# Patient Record
Sex: Female | Born: 1950
Health system: Southern US, Community
[De-identification: ages and names within clinical notes are randomized; demographics above are authoritative.]

## PROBLEM LIST (undated history)

## (undated) DIAGNOSIS — R7303 Prediabetes: Secondary | ICD-10-CM

## (undated) DIAGNOSIS — R51 Headache: Secondary | ICD-10-CM

## (undated) DIAGNOSIS — M1990 Unspecified osteoarthritis, unspecified site: Secondary | ICD-10-CM

## (undated) DIAGNOSIS — I10 Essential (primary) hypertension: Secondary | ICD-10-CM

## (undated) DIAGNOSIS — K219 Gastro-esophageal reflux disease without esophagitis: Secondary | ICD-10-CM

## (undated) DIAGNOSIS — R519 Headache, unspecified: Secondary | ICD-10-CM

## (undated) DIAGNOSIS — L309 Dermatitis, unspecified: Secondary | ICD-10-CM

## (undated) DIAGNOSIS — T753XXA Motion sickness, initial encounter: Secondary | ICD-10-CM

## (undated) DIAGNOSIS — M25561 Pain in right knee: Secondary | ICD-10-CM

## (undated) DIAGNOSIS — J329 Chronic sinusitis, unspecified: Secondary | ICD-10-CM

## (undated) DIAGNOSIS — N952 Postmenopausal atrophic vaginitis: Secondary | ICD-10-CM

## (undated) HISTORY — DX: Postmenopausal atrophic vaginitis: N95.2

## (undated) HISTORY — PX: UTERINE FIBROID SURGERY: SHX826

## (undated) HISTORY — DX: Chronic sinusitis, unspecified: J32.9

## (undated) HISTORY — DX: Unspecified osteoarthritis, unspecified site: M19.90

---

## 2006-10-01 ENCOUNTER — Ambulatory Visit: Payer: Self-pay | Admitting: Internal Medicine

## 2007-10-05 ENCOUNTER — Ambulatory Visit: Payer: Self-pay | Admitting: Internal Medicine

## 2008-10-10 ENCOUNTER — Ambulatory Visit: Payer: Self-pay | Admitting: Internal Medicine

## 2009-03-09 ENCOUNTER — Ambulatory Visit: Payer: Self-pay | Admitting: General Practice

## 2009-04-05 ENCOUNTER — Encounter: Payer: Self-pay | Admitting: General Practice

## 2009-04-08 ENCOUNTER — Encounter: Payer: Self-pay | Admitting: General Practice

## 2009-05-09 ENCOUNTER — Encounter: Payer: Self-pay | Admitting: General Practice

## 2009-10-22 ENCOUNTER — Ambulatory Visit: Payer: Self-pay | Admitting: Internal Medicine

## 2009-11-29 ENCOUNTER — Ambulatory Visit: Payer: Self-pay | Admitting: Unknown Physician Specialty

## 2009-11-30 ENCOUNTER — Ambulatory Visit: Payer: Self-pay | Admitting: Unknown Physician Specialty

## 2010-12-17 ENCOUNTER — Ambulatory Visit: Payer: Self-pay | Admitting: Internal Medicine

## 2012-01-12 ENCOUNTER — Ambulatory Visit: Payer: Self-pay

## 2013-01-12 ENCOUNTER — Ambulatory Visit: Payer: Self-pay

## 2014-07-07 ENCOUNTER — Ambulatory Visit: Payer: Self-pay

## 2015-06-12 ENCOUNTER — Other Ambulatory Visit: Payer: Self-pay | Admitting: Internal Medicine

## 2015-06-12 DIAGNOSIS — Z1231 Encounter for screening mammogram for malignant neoplasm of breast: Secondary | ICD-10-CM

## 2015-06-14 ENCOUNTER — Telehealth: Payer: Self-pay | Admitting: Gastroenterology

## 2015-06-14 ENCOUNTER — Other Ambulatory Visit: Payer: Self-pay

## 2015-06-14 NOTE — Telephone Encounter (Signed)
Gastroenterology Pre-Procedure Review  Request Date: 07-02-2015 Requesting Physician: Dr.   PATIENT REVIEW QUESTIONS: The patient responded to the following health history questions as indicated:    1. Are you having any GI issues? no 2. Do you have a personal history of Polyps? no 3. Do you have a family history of Colon Cancer or Polyps? no 4. Diabetes Mellitus? no 5. Joint replacements in the past 12 months?no 6. Major health problems in the past 3 months?no 7. Any artificial heart valves, MVP, or defibrillator?no    MEDICATIONS & ALLERGIES:    Patient reports the following regarding taking any anticoagulation/antiplatelet therapy:   Plavix, Coumadin, Eliquis, Xarelto, Lovenox, Pradaxa, Brilinta, or Effient? no Aspirin? yes ( Daiy 81mg )  Patient confirms/reports the following medications:  *Lisinopril *Coreg No current outpatient prescriptions on file.   No current facility-administered medications for this visit.    Patient confirms/reports the following allergies:  Allergies not on file  No orders of the defined types were placed in this encounter.    AUTHORIZATION INFORMATION Primary Insurance: 1D#: Group #:  Secondary Insurance: 1D#: Group #:  SCHEDULE INFORMATION: Date: 07-04-2015 Time: Location:

## 2015-06-26 ENCOUNTER — Encounter: Payer: Self-pay | Admitting: *Deleted

## 2015-06-27 ENCOUNTER — Telehealth: Payer: Self-pay | Admitting: Gastroenterology

## 2015-06-27 NOTE — Telephone Encounter (Signed)
No authorization is required for CPT: 45378 per automated voice response with [employer] at [phone].

## 2015-06-28 NOTE — Discharge Instructions (Signed)

## 2015-07-02 ENCOUNTER — Ambulatory Visit
Admission: RE | Admit: 2015-07-02 | Discharge: 2015-07-02 | Disposition: A | Discharge status: Home / Self Care | Payer: Medicare PPO | Source: Ambulatory Visit | Attending: Gastroenterology | Admitting: Gastroenterology

## 2015-07-02 ENCOUNTER — Ambulatory Visit: Payer: Medicare PPO | Admitting: Anesthesiology

## 2015-07-02 ENCOUNTER — Encounter
Admission: RE | Disposition: A | Discharge status: Home / Self Care | Payer: Self-pay | Source: Ambulatory Visit | Attending: Gastroenterology

## 2015-07-02 ENCOUNTER — Other Ambulatory Visit: Payer: Self-pay | Admitting: Gastroenterology

## 2015-07-02 DIAGNOSIS — M19072 Primary osteoarthritis, left ankle and foot: Secondary | ICD-10-CM | POA: Diagnosis not present

## 2015-07-02 DIAGNOSIS — Z79899 Other long term (current) drug therapy: Secondary | ICD-10-CM | POA: Insufficient documentation

## 2015-07-02 DIAGNOSIS — D125 Benign neoplasm of sigmoid colon: Secondary | ICD-10-CM | POA: Insufficient documentation

## 2015-07-02 DIAGNOSIS — I10 Essential (primary) hypertension: Secondary | ICD-10-CM | POA: Insufficient documentation

## 2015-07-02 DIAGNOSIS — Z1211 Encounter for screening for malignant neoplasm of colon: Secondary | ICD-10-CM | POA: Diagnosis present

## 2015-07-02 DIAGNOSIS — K5730 Diverticulosis of large intestine without perforation or abscess without bleeding: Secondary | ICD-10-CM | POA: Insufficient documentation

## 2015-07-02 DIAGNOSIS — M19071 Primary osteoarthritis, right ankle and foot: Secondary | ICD-10-CM | POA: Insufficient documentation

## 2015-07-02 DIAGNOSIS — Z7982 Long term (current) use of aspirin: Secondary | ICD-10-CM | POA: Diagnosis not present

## 2015-07-02 DIAGNOSIS — M19041 Primary osteoarthritis, right hand: Secondary | ICD-10-CM | POA: Diagnosis not present

## 2015-07-02 DIAGNOSIS — K635 Polyp of colon: Secondary | ICD-10-CM | POA: Diagnosis not present

## 2015-07-02 DIAGNOSIS — K219 Gastro-esophageal reflux disease without esophagitis: Secondary | ICD-10-CM | POA: Insufficient documentation

## 2015-07-02 DIAGNOSIS — M170 Bilateral primary osteoarthritis of knee: Secondary | ICD-10-CM | POA: Insufficient documentation

## 2015-07-02 DIAGNOSIS — Z0001 Encounter for general adult medical examination with abnormal findings: Secondary | ICD-10-CM | POA: Insufficient documentation

## 2015-07-02 DIAGNOSIS — M19042 Primary osteoarthritis, left hand: Secondary | ICD-10-CM | POA: Diagnosis not present

## 2015-07-02 DIAGNOSIS — F17210 Nicotine dependence, cigarettes, uncomplicated: Secondary | ICD-10-CM | POA: Diagnosis not present

## 2015-07-02 HISTORY — DX: Pain in right knee: M25.561

## 2015-07-02 HISTORY — DX: Gastro-esophageal reflux disease without esophagitis: K21.9

## 2015-07-02 HISTORY — PX: POLYPECTOMY: SHX5525

## 2015-07-02 HISTORY — DX: Headache: R51

## 2015-07-02 HISTORY — DX: Dermatitis, unspecified: L30.9

## 2015-07-02 HISTORY — DX: Headache, unspecified: R51.9

## 2015-07-02 HISTORY — DX: Essential (primary) hypertension: I10

## 2015-07-02 HISTORY — DX: Unspecified osteoarthritis, unspecified site: M19.90

## 2015-07-02 HISTORY — PX: COLONOSCOPY WITH PROPOFOL: SHX5780

## 2015-07-02 HISTORY — DX: Motion sickness, initial encounter: T75.3XXA

## 2015-07-02 SURGERY — COLONOSCOPY WITH PROPOFOL
Anesthesia: Monitor Anesthesia Care | Wound class: Contaminated

## 2015-07-02 MED ORDER — PROPOFOL 10 MG/ML IV BOLUS
INTRAVENOUS | Status: DC | PRN
  Administered 2015-07-02: 20 mg via INTRAVENOUS
  Administered 2015-07-02: 40 mg via INTRAVENOUS
  Administered 2015-07-02: 20 mg via INTRAVENOUS
  Administered 2015-07-02 (×2): 40 mg via INTRAVENOUS
  Administered 2015-07-02: 80 mg via INTRAVENOUS

## 2015-07-02 MED ORDER — LIDOCAINE HCL (CARDIAC) 20 MG/ML IV SOLN
INTRAVENOUS | Status: DC | PRN
  Administered 2015-07-02: 30 mg via INTRAVENOUS

## 2015-07-02 MED ORDER — SODIUM CHLORIDE 0.9 % IV SOLN: INTRAVENOUS | Status: DC

## 2015-07-02 MED ORDER — LACTATED RINGERS IV SOLN
INTRAVENOUS | Status: DC
  Administered 2015-07-02: 10:00:00 via INTRAVENOUS

## 2015-07-02 MED ORDER — SIMETHICONE 40 MG/0.6ML PO SUSP
ORAL | Status: DC | PRN
  Administered 2015-07-02: 10:00:00

## 2015-07-02 SURGICAL SUPPLY — 28 items
CANISTER SUCT 1200ML W/VALVE (MISCELLANEOUS) ×4 IMPLANT
FCP ESCP3.2XJMB 240X2.8X (MISCELLANEOUS)
FORCEPS BIOP RAD 4 LRG CAP 4 (CUTTING FORCEPS) IMPLANT
FORCEPS BIOP RJ4 240 W/NDL (MISCELLANEOUS)
FORCEPS ESCP3.2XJMB 240X2.8X (MISCELLANEOUS) IMPLANT
GOWN CVR UNV OPN BCK APRN NK (MISCELLANEOUS) ×4 IMPLANT
GOWN ISOL THUMB LOOP REG UNIV (MISCELLANEOUS) ×4
HEMOCLIP INSTINCT (CLIP) ×4 IMPLANT
INJECTOR VARIJECT VIN23 (MISCELLANEOUS) IMPLANT
KIT CO2 TUBING (TUBING) IMPLANT
KIT DEFENDO VALVE AND CONN (KITS) IMPLANT
KIT ENDO PROCEDURE OLY (KITS) ×4 IMPLANT
LIGATOR MULTIBAND 6SHOOTER MBL (MISCELLANEOUS) IMPLANT
MARKER SPOT ENDO TATTOO 5ML (MISCELLANEOUS) IMPLANT
PAD GROUND ADULT SPLIT (MISCELLANEOUS) IMPLANT
SNARE SHORT THROW 13M SML OVAL (MISCELLANEOUS) ×4 IMPLANT
SNARE SHORT THROW 30M LRG OVAL (MISCELLANEOUS) IMPLANT
SPOT EX ENDOSCOPIC TATTOO (MISCELLANEOUS)
SUCTION POLY TRAP 4CHAMBER (MISCELLANEOUS) IMPLANT
TRAP SUCTION POLY (MISCELLANEOUS) ×4 IMPLANT
TUBING CONN 6MMX3.1M (TUBING)
TUBING SUCTION CONN 0.25 STRL (TUBING) IMPLANT
UNDERPAD 30X60 958B10 (PK) (MISCELLANEOUS) IMPLANT
VALVE BIOPSY ENDO (VALVE) IMPLANT
VARIJECT INJECTOR VIN23 (MISCELLANEOUS)
WATER AUXILLARY (MISCELLANEOUS) IMPLANT
WATER STERILE IRR 250ML POUR (IV SOLUTION) ×4 IMPLANT
WATER STERILE IRR 500ML POUR (IV SOLUTION) IMPLANT

## 2015-07-02 NOTE — H&P (Signed)
  [facility]  [address] [place], [place] [address] Phone: [phone] Fax : [phone]  Primary Care Physician:  Lavera Guise, MD Primary Gastroenterologist:  Dr. Allen Norris  Pre-Procedure History & Physical: HPI:  Donna Beasley is a 64 y.o. female is here for a screening colonoscopy.   Past Medical History  Diagnosis Date  . Arthritis     osteo - hands, toes, knees  . GERD (gastroesophageal reflux disease)     occas.  . Headache     sinus  . Eczema   . Knee pain, right   . Hypertension   . Motion sickness     boats (sometimes)    Past Surgical History  Procedure Laterality Date  . Uterine fibroid surgery      Prior to Admission medications   Medication Sig Start Date End Date Taking? Authorizing Provider  aspirin 81 MG tablet Take 81 mg by mouth daily.   Yes Historical Provider, MD  carvedilol (COREG) 25 MG tablet Take 25 mg by mouth 2 (two) times daily with a meal.   Yes Historical Provider, MD  hydrocortisone cream 0.5 % Apply 1 application topically 2 (two) times daily as needed for itching.   Yes Historical Provider, MD  lisinopril-hydrochlorothiazide (PRINZIDE,ZESTORETIC) 20-12.5 MG tablet Take 1 tablet by mouth daily. AM   Yes Historical Provider, MD  Multiple Vitamin (MULTIVITAMIN) tablet Take 1 tablet by mouth daily.   Yes Historical Provider, MD  Omega-3 Fatty Acids (FISH OIL PO) Take by mouth.   Yes Historical Provider, MD  triamcinolone cream (KENALOG) 0.1 % Apply 1 application topically 2 (two) times daily as needed.   Yes Historical Provider, MD    Allergies as of 06/14/2015  . (Not on File)    History reviewed. No pertinent family history.  Social History   Social History  . Marital Status: Single    Spouse Name: N/A  . Number of Children: N/A  . Years of Education: N/A   Occupational History  . Not on file.   Social History Main Topics  . Smoking status: Current Some Day Smoker    Types: Cigarettes  . Smokeless  tobacco: Not on file     Comment: may smoke 2 packs/wk. quit 06/25/15 for procedure  . Alcohol Use: Yes     Comment: 4-5 drinks per year  . Drug Use: Not on file  . Sexual Activity: Not on file   Other Topics Concern  . Not on file   Social History Narrative  . No narrative on file    Review of Systems: See HPI, otherwise negative ROS  Physical Exam: BP 150/70 mmHg  Pulse 78  Temp(Src) 97.9 F (36.6 C)  Resp 16  Ht 5\' 3"  (1.6 m)  Wt 227 lb (102.967 kg)  BMI 40.22 kg/m2  SpO2 98% General:   Alert,  pleasant and cooperative in NAD Head:  Normocephalic and atraumatic. Neck:  Supple; no masses or thyromegaly. Lungs:  Clear throughout to auscultation.    Heart:  Regular rate and rhythm. Abdomen:  Soft, nontender and nondistended. Normal bowel sounds, without guarding, and without rebound.   Neurologic:  Alert and  oriented x4;  grossly normal neurologically.  Impression/Plan: Donna Beasley is now here to undergo a screening colonoscopy.  Risks, benefits, and alternatives regarding colonoscopy have been reviewed with the patient.  Questions have been answered.  All parties agreeable.

## 2015-07-02 NOTE — Anesthesia Postprocedure Evaluation (Signed)
  Anesthesia Post-op Note  Patient: Donna Beasley  Procedure(s) Performed: Procedure(s): COLONOSCOPY WITH PROPOFOL (N/A) POLYPECTOMY  Anesthesia type:MAC  Patient location: PACU  Post pain: Pain level controlled  Post assessment: Post-op Vital signs reviewed, Patient's Cardiovascular Status Stable, Respiratory Function Stable, Patent Airway and No signs of Nausea or vomiting  Post vital signs: Reviewed and stable  Last Vitals:  Filed Vitals:   07/02/15 1110  BP: 109/59  Pulse: 62  Temp:   Resp: 20    Level of consciousness: awake, alert  and patient cooperative  Complications: No apparent anesthesia complications

## 2015-07-02 NOTE — Op Note (Signed)
[facility] Gastroenterology Patient Name: Donna Beasley Procedure Date: 07/02/2015 10:12 AM MRN: [id number] Account #: [id number] Date of Birth: April 20, 1951 Admit Type: Outpatient Age: 64 Room: [facility] OR ROOM 01 Gender: Female Note Status: Finalized Procedure:         Colonoscopy Indications:       Screening for colorectal malignant neoplasm Providers:         Lucilla Lame, MD Referring MD:      Lavera Guise, MD (Referring MD) Medicines:         Propofol per Anesthesia Complications:     No immediate complications. Procedure:         Pre-Anesthesia Assessment:                    - Prior to the procedure, a History and Physical was                     performed, and patient medications and allergies were                     reviewed. The patient's tolerance of previous anesthesia                     was also reviewed. The risks and benefits of the procedure                     and the sedation options and risks were discussed with the                     patient. All questions were answered, and informed consent                     was obtained. Prior Anticoagulants: The patient has taken                     no previous anticoagulant or antiplatelet agents. ASA                     Grade Assessment: II - A patient with mild systemic                     disease. After reviewing the risks and benefits, the                     patient was deemed in satisfactory condition to undergo                     the procedure.                    After obtaining informed consent, the colonoscope was                     passed under direct vision. Throughout the procedure, the                     patient's blood pressure, pulse, and oxygen saturations                     were monitored continuously. The Olympus CF H180AL                     colonoscope (S#: [id number]) was introduced through the anus  and advanced to the the cecum, identified by appendiceal            orifice and ileocecal valve. The colonoscopy was performed                     without difficulty. The patient tolerated the procedure                     well. The quality of the bowel preparation was excellent. Findings:      The perianal and digital rectal examinations were normal.      Multiple large-mouthed diverticula were found in the sigmoid colon.      Two sessile polyps were found in the sigmoid colon. The polyps were 2 to       3 mm in size. These polyps were removed with a cold biopsy forceps.       Resection and retrieval were complete.      A 8 mm polyp was found in the sigmoid colon. The polyp was pedunculated.       The polyp was removed with a hot snare. Resection and retrieval were       complete.      Two sessile polyps were found in the sigmoid colon. The polyps were 4 to       5 mm in size. These polyps were removed with a cold snare. Resection and       retrieval were complete. Impression:        - Diverticulosis in the sigmoid colon.                    - Two 2 to 3 mm polyps in the sigmoid colon. Resected and                     retrieved.                    - One 8 mm polyp in the sigmoid colon. Resected and                     retrieved.                    - Two 4 to 5 mm polyps in the sigmoid colon. Resected and                     retrieved. Recommendation:    - Await pathology results.                    - Repeat colonoscopy in 5 years if polyp adenoma and 10                     years if hyperplastic Procedure Code(s): --- Professional ---                    [phone], Colonoscopy, flexible; with removal of tumor(s),                     polyp(s), or other lesion(s) by snare technique                    46568, 72, Colonoscopy, flexible; with biopsy, single or                     multiple Diagnosis Code(s): --- Professional ---  Z12.11, Encounter for screening for malignant neoplasm of                     colon                     D12.5, Benign neoplasm of sigmoid colon CPT copyright 2014 American Medical Association. All rights reserved. The codes documented in this report are preliminary and upon coder review may  be revised to meet current compliance requirements. Lucilla Lame, MD 07/02/2015 10:44:28 AM This report has been signed electronically. Number of Addenda: 0 Note Initiated On: 07/02/2015 10:12 AM Scope Withdrawal Time: 0 hours 12 minutes 36 seconds  Total Procedure Duration: 0 hours 15 minutes 25 seconds       [facility]

## 2015-07-02 NOTE — Anesthesia Preprocedure Evaluation (Signed)
Anesthesia Evaluation  Patient identified by MRN, date of birth, ID band Patient awake    Reviewed: Allergy & Precautions, NPO status , Patient's Chart, lab work & pertinent test results  Airway Mallampati: II  TM Distance: >3 FB Neck ROM: Full    Dental no notable dental hx.    Pulmonary neg pulmonary ROS, Current Smoker,    Pulmonary exam normal breath sounds clear to auscultation       Cardiovascular hypertension, negative cardio ROS Normal cardiovascular exam Rhythm:Regular Rate:Normal     Neuro/Psych  Headaches, negative neurological ROS  negative psych ROS   GI/Hepatic negative GI ROS, Neg liver ROS,   Endo/Other  negative endocrine ROS  Renal/GU negative Renal ROS  negative genitourinary   Musculoskeletal negative musculoskeletal ROS (+)   Abdominal   Peds negative pediatric ROS (+)  Hematology negative hematology ROS (+)   Anesthesia Other Findings   Reproductive/Obstetrics negative OB ROS                             Anesthesia Physical Anesthesia Plan  ASA: III  Anesthesia Plan: MAC   Post-op Pain Management:    Induction: Intravenous  Airway Management Planned:   Additional Equipment:   Intra-op Plan:   Post-operative Plan: Extubation in OR  Informed Consent: I have reviewed the patients History and Physical, chart, labs and discussed the procedure including the risks, benefits and alternatives for the proposed anesthesia with the patient or authorized representative who has indicated his/her understanding and acceptance.   Dental advisory given  Plan Discussed with: CRNA  Anesthesia Plan Comments:         Anesthesia Quick Evaluation

## 2015-07-02 NOTE — Transfer of Care (Signed)
Immediate Anesthesia Transfer of Care Note  Patient: Donna Beasley  Procedure(s) Performed: Procedure(s): COLONOSCOPY WITH PROPOFOL (N/A) POLYPECTOMY  Patient Location: PACU  Anesthesia Type: MAC  Level of Consciousness: awake, alert  and patient cooperative  Airway and Oxygen Therapy: Patient Spontanous Breathing and Patient connected to supplemental oxygen  Post-op Assessment: Post-op Vital signs reviewed, Patient's Cardiovascular Status Stable, Respiratory Function Stable, Patent Airway and No signs of Nausea or vomiting  Post-op Vital Signs: Reviewed and stable  Complications: No apparent anesthesia complications

## 2015-07-03 ENCOUNTER — Encounter: Payer: Self-pay | Admitting: Gastroenterology

## 2015-07-09 ENCOUNTER — Encounter: Payer: Self-pay | Admitting: Gastroenterology

## 2015-07-11 ENCOUNTER — Ambulatory Visit
Admission: RE | Admit: 2015-07-11 | Discharge: 2015-07-11 | Disposition: A | Discharge status: Home / Self Care | Payer: Medicare PPO | Source: Ambulatory Visit | Attending: Internal Medicine | Admitting: Internal Medicine

## 2015-07-11 ENCOUNTER — Other Ambulatory Visit: Payer: Self-pay | Admitting: Internal Medicine

## 2015-07-11 DIAGNOSIS — Z1231 Encounter for screening mammogram for malignant neoplasm of breast: Secondary | ICD-10-CM

## 2016-06-17 ENCOUNTER — Other Ambulatory Visit: Payer: Self-pay | Admitting: Physician Assistant

## 2016-06-17 DIAGNOSIS — Z1231 Encounter for screening mammogram for malignant neoplasm of breast: Secondary | ICD-10-CM

## 2016-07-24 ENCOUNTER — Ambulatory Visit
Admission: RE | Admit: 2016-07-24 | Discharge: 2016-07-24 | Disposition: A | Discharge status: Home / Self Care | Payer: Medicare Other | Source: Ambulatory Visit | Attending: Physician Assistant | Admitting: Physician Assistant

## 2016-07-24 DIAGNOSIS — Z1231 Encounter for screening mammogram for malignant neoplasm of breast: Secondary | ICD-10-CM

## 2017-06-25 ENCOUNTER — Other Ambulatory Visit: Payer: Self-pay | Admitting: Internal Medicine

## 2017-06-25 DIAGNOSIS — Z1231 Encounter for screening mammogram for malignant neoplasm of breast: Secondary | ICD-10-CM

## 2017-07-28 ENCOUNTER — Ambulatory Visit
Admission: RE | Admit: 2017-07-28 | Discharge: 2017-07-28 | Disposition: A | Discharge status: Home / Self Care | Payer: Medicare Other | Source: Ambulatory Visit | Attending: Internal Medicine | Admitting: Internal Medicine

## 2017-07-28 DIAGNOSIS — Z1231 Encounter for screening mammogram for malignant neoplasm of breast: Secondary | ICD-10-CM | POA: Diagnosis present

## 2017-09-17 ENCOUNTER — Other Ambulatory Visit: Payer: Self-pay

## 2017-09-17 ENCOUNTER — Ambulatory Visit: Payer: Medicare Other | Admitting: Nurse Practitioner

## 2017-09-17 ENCOUNTER — Encounter: Payer: Self-pay | Admitting: Nurse Practitioner

## 2017-09-17 VITALS — BP 150/90 | HR 62 | Resp 16 | Ht 64.0 in | Wt 219.0 lb

## 2017-09-17 DIAGNOSIS — E119 Type 2 diabetes mellitus without complications: Secondary | ICD-10-CM | POA: Insufficient documentation

## 2017-09-17 DIAGNOSIS — I10 Essential (primary) hypertension: Secondary | ICD-10-CM

## 2017-09-17 DIAGNOSIS — D72829 Elevated white blood cell count, unspecified: Secondary | ICD-10-CM | POA: Insufficient documentation

## 2017-09-17 DIAGNOSIS — N814 Uterovaginal prolapse, unspecified: Secondary | ICD-10-CM | POA: Insufficient documentation

## 2017-09-17 NOTE — Progress Notes (Signed)
[facility] [place], [place] [address]  Internal MEDICINE  Office Visit Note  Patient Name: Donna Beasley  [id number]  [id number]  Date of Service: 09/17/2017     Complaints/HPI Pt is here for routine follow up. Chief Complaint  Patient presents with  . Other    swelling in vaginal area   Other  This is a chronic problem. The current episode started more than 1 year ago. The problem occurs constantly. The problem has been gradually worsening. Associated symptoms include congestion, coughing and urinary symptoms. Pertinent negatives include no arthralgias, chest pain, fatigue, fever, headaches, myalgias, nausea, vomiting or weakness. Associated symptoms comments: Some leakage of urine, especially with coughing. . The symptoms are aggravated by coughing. She has tried nothing for the symptoms.    Current Medication: Outpatient Encounter Medications as of 09/17/2017  Medication Sig  . aspirin 81 MG tablet Take 81 mg by mouth daily.  . carvedilol (COREG) 25 MG tablet Take 25 mg by mouth 2 (two) times daily with a meal.  . hydrocortisone cream 0.5 % Apply 1 application topically 2 (two) times daily as needed for itching.  Marland Kitchen lisinopril-hydrochlorothiazide (PRINZIDE,ZESTORETIC) 20-12.5 MG tablet Take 1 tablet by mouth daily. AM  . Multiple Vitamin (MULTIVITAMIN) tablet Take 1 tablet by mouth daily.  Marland Kitchen triamcinolone cream (KENALOG) 0.1 % Apply 1 application topically 2 (two) times daily as needed.  . conjugated estrogens (PREMARIN) vaginal cream Place 1 Applicatorful vaginally once a week. At night.  . Omega-3 Fatty Acids (FISH OIL PO) Take by mouth.   No facility-administered encounter medications on file as of 09/17/2017.     Surgical History: Past Surgical History:  Procedure Laterality Date  . COLONOSCOPY WITH PROPOFOL N/A 07/02/2015   Procedure: COLONOSCOPY WITH PROPOFOL;  Surgeon: Lucilla Lame, MD;  Location: [facility];  Service: Endoscopy;   Laterality: N/A;  . POLYPECTOMY  07/02/2015   Procedure: POLYPECTOMY;  Surgeon: Lucilla Lame, MD;  Location: [facility];  Service: Endoscopy;;  . UTERINE FIBROID SURGERY      Medical History: Past Medical History:  Diagnosis Date  . Arthritis    osteo - hands, toes, knees  . Atrophic vaginitis   . Eczema   . GERD (gastroesophageal reflux disease)    occas.  . Headache    sinus  . Hypertension   . Knee pain, right   . Motion sickness    boats (sometimes)  . Osteoarthritis   . Sinusitis     Family History: No family history on file.  Social History   Socioeconomic History  . Marital status: Single    Spouse name: Not on file  . Number of children: Not on file  . Years of education: Not on file  . Highest education level: Not on file  Social Needs  . Financial resource strain: Not on file  . Food insecurity - worry: Not on file  . Food insecurity - inability: Not on file  . Transportation needs - medical: Not on file  . Transportation needs - non-medical: Not on file  Occupational History  . Not on file  Tobacco Use  . Smoking status: Current Some Day Smoker    Types: Cigarettes  . Smokeless tobacco: Never Used  . Tobacco comment: may smoke 2 packs/wk. quit 06/25/15 for procedure  Substance and Sexual Activity  . Alcohol use: Yes    Comment: 4-5 drinks per year  . Drug use: Not on file  . Sexual activity: Not on  file  Other Topics Concern  . Not on file  Social History Narrative  . Not on file      Review of Systems  Constitutional: Negative for fatigue and fever.  HENT: Positive for congestion. Negative for postnasal drip and rhinorrhea.   Respiratory: Positive for cough.   Cardiovascular: Negative for chest pain and palpitations.       Elevated blood pressures, especially in the office. Blood pressure readings at home are generally 130/80  Gastrointestinal: Negative for constipation, diarrhea, nausea and vomiting.  Endocrine: Negative for  cold intolerance, heat intolerance, polydipsia, polyphagia and polyuria.  Genitourinary:       The patient feels area of swelling in vaginal area. Does not hurt. Feels it more now, after she has been suffering from cold and cough for a few weeks. Does note some frequency of urination and leakage of urine without painful urination or abdominal pain.   Musculoskeletal: Negative for arthralgias, back pain and myalgias.  Skin: Negative.   Allergic/Immunologic: Negative.   Neurological: Negative for weakness and headaches.  Hematological: Negative.   Psychiatric/Behavioral: The patient is not nervous/anxious.     Today's Vitals   09/17/17 1052  BP: (!) 150/90  Pulse: 62  Resp: 16  SpO2: 96%  Weight: 219 lb (99.3 kg)  Height: 5\' 4"  (1.626 m)    Physical Exam  Constitutional: She is oriented to person, place, and time. She appears well-developed and well-nourished.  HENT:  Head: Normocephalic and atraumatic.  Right Ear: External ear normal.  Left Ear: External ear normal.  Eyes: Pupils are equal, round, and reactive to light.  Neck: Normal range of motion. Neck supple. No thyromegaly present.  Cardiovascular: Normal rate, regular rhythm and normal heart sounds.  Pulmonary/Chest: Effort normal and breath sounds normal. She has no wheezes.  Abdominal: Soft. Bowel sounds are normal. There is no tenderness.  Genitourinary:     Musculoskeletal: Normal range of motion.  Lymphadenopathy:    She has no cervical adenopathy.  Neurological: She is alert and oriented to person, place, and time.  Skin: Skin is warm and dry.  Psychiatric: She has a normal mood and affect.  Nursing note and vitals reviewed.   Assessment/Plan:    ICD-10-CM   1. Essential (primary) hypertension I10   2. Uterovaginal prolapse, unspecified N81.4     1. BP generally stable. Continue bp medications without changes today 2. Bladder prolapse present. Currently, not causing distressing symptoms for the patient.  Discussed referral to urology in the future for repair, when desired.    General Counseling: I have discussed the findings of the evaluation and examination with Destany.  I have also discussed any further diagnostic evaluation that may be needed or ordered today. Fujie verbalizes understanding of the findings of todays visit. We also reviewed her medications today. she has been encouraged to call the office with any questions or concerns that should arise related to todays visit.  This patient was seen by Leretha Pol, FNP- C in Collaboration with Dr Lavera Guise as a part of collaborative care agreement   Time spent: 25 minutes    Dr Lavera Guise Internal medicine

## 2018-02-14 ENCOUNTER — Other Ambulatory Visit: Payer: Self-pay | Admitting: Internal Medicine

## 2018-02-22 ENCOUNTER — Ambulatory Visit: Payer: Medicare Other | Admitting: Nurse Practitioner

## 2018-02-22 ENCOUNTER — Encounter: Payer: Self-pay | Admitting: Nurse Practitioner

## 2018-02-22 VITALS — BP 142/72 | HR 67 | Resp 16 | Ht 64.0 in | Wt 212.0 lb

## 2018-02-22 DIAGNOSIS — N952 Postmenopausal atrophic vaginitis: Secondary | ICD-10-CM | POA: Diagnosis not present

## 2018-02-22 DIAGNOSIS — I10 Essential (primary) hypertension: Secondary | ICD-10-CM | POA: Diagnosis not present

## 2018-02-22 DIAGNOSIS — Z23 Encounter for immunization: Secondary | ICD-10-CM | POA: Diagnosis not present

## 2018-02-22 MED ORDER — ZOSTER VAC RECOMB ADJUVANTED 50 MCG/0.5ML IM SUSR: 0.5000 mL | Freq: Once | INTRAMUSCULAR | 0 refills | Status: AC

## 2018-02-22 NOTE — Progress Notes (Signed)
[facility] [place], [place] [address]  Internal MEDICINE  Office Visit Note  Patient Name: Donna Beasley  [id number]  [id number]  Date of Service: 03/13/2018     Pt is here for routine follow up.   Chief Complaint  Patient presents with  . Hypertension    f/u    Hypertension  This is a chronic problem. The current episode started more than 1 year ago. The problem is unchanged. The problem is controlled. Associated symptoms include malaise/fatigue. Pertinent negatives include no chest pain, headaches or palpitations. Agents associated with hypertension include estrogens. Risk factors for coronary artery disease include obesity and post-menopausal state. Past treatments include ACE inhibitors and diuretics. The current treatment provides moderate improvement. Compliance problems include exercise.       Current Medication: Outpatient Encounter Medications as of 02/22/2018  Medication Sig  . aspirin 81 MG tablet Take 81 mg by mouth daily.  . carvedilol (COREG) 25 MG tablet Take 25 mg by mouth 2 (two) times daily with a meal.  . conjugated estrogens (PREMARIN) vaginal cream Place 1 Applicatorful vaginally once a week. At night.  . hydrocortisone cream 0.5 % Apply 1 application topically 2 (two) times daily as needed for itching.  Marland Kitchen lisinopril-hydrochlorothiazide (PRINZIDE,ZESTORETIC) 20-12.5 MG tablet TAKE 1 TABLET BY MOUTH EVERY DAY FOR BLOOD PRESSURE  . Multiple Vitamin (MULTIVITAMIN) tablet Take 1 tablet by mouth daily.  . Omega-3 Fatty Acids (FISH OIL PO) Take by mouth.  . triamcinolone cream (KENALOG) 0.1 % Apply 1 application topically 2 (two) times daily as needed.  . [EXPIRED] Zoster Vaccine Adjuvanted [facility]) injection Inject 0.5 mLs into the muscle once for 1 dose.   No facility-administered encounter medications on file as of 02/22/2018.     Surgical History: Past Surgical History:  Procedure Laterality Date  . COLONOSCOPY WITH PROPOFOL  N/A 07/02/2015   Procedure: COLONOSCOPY WITH PROPOFOL;  Surgeon: Lucilla Lame, MD;  Location: [place];  Service: Endoscopy;  Laterality: N/A;  . POLYPECTOMY  07/02/2015   Procedure: POLYPECTOMY;  Surgeon: Lucilla Lame, MD;  Location: [place];  Service: Endoscopy;;  . UTERINE FIBROID SURGERY      Medical History: Past Medical History:  Diagnosis Date  . Arthritis    osteo - hands, toes, knees  . Atrophic vaginitis   . Eczema   . GERD (gastroesophageal reflux disease)    occas.  . Headache    sinus  . Hypertension   . Knee pain, right   . Motion sickness    boats (sometimes)  . Osteoarthritis   . Sinusitis     Family History: Family History  Problem Relation Age of Onset  . Hypertension Mother   . Thyroid disease Mother   . Hypertension Father     Social History   Socioeconomic History  . Marital status: Single    Spouse name: Not on file  . Number of children: Not on file  . Years of education: Not on file  . Highest education level: Not on file  Occupational History  . Not on file  Social Needs  . Financial resource strain: Not on file  . Food insecurity:    Worry: Not on file    Inability: Not on file  . Transportation needs:    Medical: Not on file    Non-medical: Not on file  Tobacco Use  . Smoking status: Current Some Day Smoker    Packs/day: 0.50    Types: Cigarettes  .  Smokeless tobacco: Never Used  . Tobacco comment: may smoke 2 packs/wk. quit 06/25/15 for procedure  Substance and Sexual Activity  . Alcohol use: Yes    Comment: 4-5 drinks per year  . Drug use: Not on file  . Sexual activity: Not on file  Lifestyle  . Physical activity:    Days per week: Not on file    Minutes per session: Not on file  . Stress: Not on file  Relationships  . Social connections:    Talks on phone: Not on file    Gets together: Not on file    Attends religious service: Not on file    Active member of club or organization: Not on file     Attends meetings of clubs or organizations: Not on file    Relationship status: Not on file  . Intimate partner violence:    Fear of current or ex partner: Not on file    Emotionally abused: Not on file    Physically abused: Not on file    Forced sexual activity: Not on file  Other Topics Concern  . Not on file  Social History Narrative  . Not on file      Review of Systems  Constitutional: Positive for malaise/fatigue. Negative for fatigue and fever.  HENT: Positive for congestion. Negative for postnasal drip and rhinorrhea.   Respiratory: Positive for cough.   Cardiovascular: Negative for chest pain and palpitations.       Elevated blood pressures, especially in the office. Blood pressure readings at home are generally 130/80  Gastrointestinal: Negative for constipation, diarrhea, nausea and vomiting.  Endocrine: Negative for cold intolerance, heat intolerance, polydipsia, polyphagia and polyuria.  Genitourinary:       The patient feels area of swelling in vaginal area. Does not hurt. Feels it more now, after she has been suffering from cold and cough for a few weeks. Does note some frequency of urination and leakage of urine without painful urination or abdominal pain.   Musculoskeletal: Negative for arthralgias, back pain and myalgias.  Skin: Negative.   Allergic/Immunologic: Negative.   Neurological: Negative for weakness and headaches.  Hematological: Negative.   Psychiatric/Behavioral: The patient is not nervous/anxious.     Today's Vitals   02/22/18 1018  BP: (!) 142/72  Pulse: 67  Resp: 16  SpO2: 97%  Weight: 212 lb (96.2 kg)  Height: 5\' 4"  (1.626 m)    Physical Exam  Constitutional: She is oriented to person, place, and time. She appears well-developed and well-nourished.  HENT:  Head: Normocephalic and atraumatic.  Right Ear: External ear normal.  Left Ear: External ear normal.  Eyes: Pupils are equal, round, and reactive to light. Conjunctivae are  normal.  Neck: Normal range of motion. Neck supple. No JVD present. Carotid bruit is not present. No tracheal deviation present. No thyromegaly present.  Cardiovascular: Normal rate, regular rhythm and normal heart sounds.  Pulmonary/Chest: Effort normal and breath sounds normal. She has no wheezes.  Abdominal: Soft. Bowel sounds are normal. There is no tenderness.  Musculoskeletal: Normal range of motion.  Lymphadenopathy:    She has no cervical adenopathy.  Neurological: She is alert and oriented to person, place, and time.  Skin: Skin is warm and dry.  Psychiatric: She has a normal mood and affect. Her behavior is normal. Judgment and thought content normal.  Nursing note and vitals reviewed.   Assessment/Plan: 1. Essential (primary) hypertension Stable. Continue bp medication as prescribed.   2. Atrophic vaginitis Continue  use of premarin cream as prescribed.   3. Need for zoster vaccine New rx for shingles vaccine series sent to patient's pharamcy for administration.  - Zoster Vaccine Adjuvanted [facility]) injection; Inject 0.5 mLs into the muscle once for 1 dose.  Dispense: 0.5 mL; Refill: 0  General Counseling: kataleyah carducci understanding of the findings of todays visit and agrees with plan of treatment. I have discussed any further diagnostic evaluation that may be needed or ordered today. We also reviewed her medications today. she has been encouraged to call the office with any questions or concerns that should arise related to todays visit.    Counseling:  Hypertension Counseling:   The following hypertensive lifestyle modification were recommended and discussed:  1. Limiting alcohol intake to less than 1 oz/day of ethanol:(24 oz of beer or 8 oz of wine or 2 oz of 100-proof whiskey). 2. Take baby ASA 81 mg daily. 3. Importance of regular aerobic exercise and losing weight. 4. Reduce dietary saturated fat and cholesterol intake for overall cardiovascular health. 5.  Maintaining adequate dietary potassium, calcium, and magnesium intake. 6. Regular monitoring of the blood pressure. 7. Reduce sodium intake to less than 100 mmol/day (less than 2.3 gm of sodium or less than 6 gm of sodium choride)   This patient was seen by Leretha Pol, FNP- C in Collaboration with Dr Lavera Guise as a part of collaborative care agreement    Meds ordered this encounter  Medications  . Zoster Vaccine Adjuvanted [facility]) injection    Sig: Inject 0.5 mLs into the muscle once for 1 dose.    Dispense:  0.5 mL    Refill:  0    Order Specific Question:   Supervising Provider    Answer:   Lavera Guise [[id number]]    Time spent: 72 Minutes     Dr Lavera Guise Internal medicine

## 2018-03-13 DIAGNOSIS — N952 Postmenopausal atrophic vaginitis: Secondary | ICD-10-CM | POA: Insufficient documentation

## 2018-03-13 DIAGNOSIS — Z23 Encounter for immunization: Secondary | ICD-10-CM | POA: Insufficient documentation

## 2018-04-25 ENCOUNTER — Other Ambulatory Visit: Payer: Self-pay | Admitting: Internal Medicine

## 2018-05-18 ENCOUNTER — Other Ambulatory Visit: Payer: Self-pay

## 2018-05-18 MED ORDER — LISINOPRIL-HYDROCHLOROTHIAZIDE 20-12.5 MG PO TABS: ORAL_TABLET | ORAL | 0 refills | Status: DC

## 2018-07-13 ENCOUNTER — Other Ambulatory Visit: Payer: Self-pay | Admitting: Nurse Practitioner

## 2018-08-23 ENCOUNTER — Other Ambulatory Visit: Payer: Self-pay | Admitting: Internal Medicine

## 2018-08-23 DIAGNOSIS — Z1231 Encounter for screening mammogram for malignant neoplasm of breast: Secondary | ICD-10-CM

## 2018-09-13 ENCOUNTER — Other Ambulatory Visit: Payer: Self-pay | Admitting: Nurse Practitioner

## 2018-09-14 LAB — CBC
Hematocrit: 39.7 % (ref 34.0–46.6)
Hemoglobin: 13.3 g/dL (ref 11.1–15.9)
MCH: 30 pg (ref 26.6–33.0)
MCHC: 33.5 g/dL (ref 31.5–35.7)
MCV: 90 fL (ref 79–97)
Platelets: 429 10*3/uL (ref 150–450)
RBC: 4.43 x10E6/uL (ref 3.77–5.28)
RDW: 13.6 % (ref 11.7–15.4)
WBC: 12.9 10*3/uL — ABNORMAL HIGH (ref 3.4–10.8)

## 2018-09-14 LAB — COMPREHENSIVE METABOLIC PANEL
A/G RATIO: 1.4 (ref 1.2–2.2)
ALBUMIN: 4 g/dL (ref 3.6–4.8)
ALT: 9 IU/L (ref 0–32)
AST: 12 IU/L (ref 0–40)
Alkaline Phosphatase: 66 IU/L (ref 39–117)
BUN/Creatinine Ratio: 18 (ref 12–28)
BUN: 14 mg/dL (ref 8–27)
Bilirubin Total: 0.3 mg/dL (ref 0.0–1.2)
CO2: 22 mmol/L (ref 20–29)
CREATININE: 0.8 mg/dL (ref 0.57–1.00)
Calcium: 8.9 mg/dL (ref 8.7–10.3)
Chloride: 101 mmol/L (ref 96–106)
GFR, EST AFRICAN AMERICAN: 87 mL/min/{1.73_m2} (ref >59)
GFR, EST NON AFRICAN AMERICAN: 75 mL/min/{1.73_m2} (ref >59)
GLOBULIN, TOTAL: 2.9 g/dL (ref 1.5–4.5)
Glucose: 120 mg/dL — ABNORMAL HIGH (ref 65–99)
POTASSIUM: 4.4 mmol/L (ref 3.5–5.2)
Sodium: 136 mmol/L (ref 134–144)
TOTAL PROTEIN: 6.9 g/dL (ref 6.0–8.5)

## 2018-09-14 LAB — LIPID PANEL W/O CHOL/HDL RATIO
Cholesterol, Total: 197 mg/dL (ref 100–199)
HDL: 37 mg/dL — ABNORMAL LOW (ref >39)
LDL Calculated: 130 mg/dL — ABNORMAL HIGH (ref 0–99)
Triglycerides: 152 mg/dL — ABNORMAL HIGH (ref 0–149)
VLDL Cholesterol Cal: 30 mg/dL (ref 5–40)

## 2018-09-14 LAB — TSH: TSH: 3.33 u[IU]/mL (ref 0.450–4.500)

## 2018-09-14 LAB — T4, FREE: FREE T4: 1.16 ng/dL (ref 0.82–1.77)

## 2018-09-14 LAB — VITAMIN D 25 HYDROXY (VIT D DEFICIENCY, FRACTURES): Vit D, 25-Hydroxy: 28 ng/mL — ABNORMAL LOW (ref 30.0–100.0)

## 2018-09-20 ENCOUNTER — Ambulatory Visit: Payer: Medicare Other | Admitting: Nurse Practitioner

## 2018-09-20 ENCOUNTER — Encounter: Payer: Self-pay | Admitting: Nurse Practitioner

## 2018-09-20 VITALS — BP 158/80 | HR 80 | Resp 16 | Ht 64.0 in | Wt 217.0 lb

## 2018-09-20 DIAGNOSIS — Z0001 Encounter for general adult medical examination with abnormal findings: Secondary | ICD-10-CM

## 2018-09-20 DIAGNOSIS — R7301 Impaired fasting glucose: Secondary | ICD-10-CM | POA: Diagnosis not present

## 2018-09-20 DIAGNOSIS — R300 Dysuria: Secondary | ICD-10-CM | POA: Diagnosis not present

## 2018-09-20 DIAGNOSIS — I10 Essential (primary) hypertension: Secondary | ICD-10-CM | POA: Diagnosis not present

## 2018-09-20 LAB — POCT GLYCOSYLATED HEMOGLOBIN (HGB A1C): Hemoglobin A1C: 6.3 % — AB (ref 4.0–5.6)

## 2018-09-20 NOTE — Addendum Note (Signed)
Addended by: Leretha Pol on: 09/20/2018 01:39 PM   Modules accepted: Level of Service

## 2018-09-20 NOTE — Addendum Note (Signed)
Addended by: Corlis Hove on: 09/20/2018 09:05 AM   Modules accepted: Orders

## 2018-09-20 NOTE — Progress Notes (Addendum)
[facility] [place], [place] [address]  Internal MEDICINE  Office Visit Note  Patient Name: Donna Beasley  [id number]  [id number]  Date of Service: 09/20/2018   Pt is here for routine health maintenance examination   Chief Complaint  Patient presents with  . Annual Exam  . Hypertension  . Gastroesophageal Reflux     The patient is here for health maintenance exam. Blood pressure is elevated. She states this happens a lot when she first arrives in the office. She as had he routine labs drawn. Blood sugar and lipid panel slightly elevated. Patient to adjust diet and increase exercise to help control these numbers. She is scheduled for mammogram later this week. She believes she has had her 2nd pneumonia vaccine. Will cal her insurance and give [place] the date of injection. .    Current Medication: Outpatient Encounter Medications as of 09/20/2018  Medication Sig  . aspirin 81 MG tablet Take 81 mg by mouth daily.  . carvedilol (COREG) 25 MG tablet TAKE 1 TABLET BY MOUTH TWICE A DAY  . conjugated estrogens (PREMARIN) vaginal cream Place 1 Applicatorful vaginally once a week. At night.  . hydrocortisone cream 0.5 % Apply 1 application topically 2 (two) times daily as needed for itching.  Marland Kitchen lisinopril-hydrochlorothiazide (PRINZIDE,ZESTORETIC) 20-12.5 MG tablet TAKE 1 TABLET BY MOUTH EVERY DAY FOR BLOOD PRESSURE  . Multiple Vitamin (MULTIVITAMIN) tablet Take 1 tablet by mouth daily.  . Omega-3 Fatty Acids (FISH OIL PO) Take by mouth.  . triamcinolone cream (KENALOG) 0.1 % Apply 1 application topically 2 (two) times daily as needed.   No facility-administered encounter medications on file as of 09/20/2018.     Surgical History: Past Surgical History:  Procedure Laterality Date  . COLONOSCOPY WITH PROPOFOL N/A 07/02/2015   Procedure: COLONOSCOPY WITH PROPOFOL;  Surgeon: Lucilla Lame, MD;  Location: [place];  Service: Endoscopy;  Laterality: N/A;   . POLYPECTOMY  07/02/2015   Procedure: POLYPECTOMY;  Surgeon: Lucilla Lame, MD;  Location: [place];  Service: Endoscopy;;  . UTERINE FIBROID SURGERY      Medical History: Past Medical History:  Diagnosis Date  . Arthritis    osteo - hands, toes, knees  . Atrophic vaginitis   . Eczema   . GERD (gastroesophageal reflux disease)    occas.  . Headache    sinus  . Hypertension   . Knee pain, right   . Motion sickness    boats (sometimes)  . Osteoarthritis   . Sinusitis     Family History: Family History  Problem Relation Age of Onset  . Hypertension Mother   . Thyroid disease Mother   . Hypertension Father       Review of Systems  Constitutional: Negative for fatigue and fever.  HENT: Negative for congestion, postnasal drip and rhinorrhea.   Respiratory: Negative for cough.   Cardiovascular: Negative for chest pain and palpitations.       Elevated blood pressures, especially in the office. Blood pressure readings at home are generally 130/80  Gastrointestinal: Negative for constipation, diarrhea, nausea and vomiting.  Endocrine: Negative for cold intolerance, heat intolerance, polyphagia and polyuria.  Musculoskeletal: Negative for arthralgias, back pain and myalgias.  Skin: Negative for rash.  Allergic/Immunologic: Negative for environmental allergies.  Neurological: Negative for dizziness, weakness, light-headedness and headaches.  Hematological: Negative for adenopathy.  Psychiatric/Behavioral: The patient is not nervous/anxious.      Today's Vitals   09/20/18 0854  BP: (!) 158/80  Pulse: 80  Resp: 16  SpO2: 95%  Weight: 217 lb (98.4 kg)  Height: 5\' 4"  (1.626 m)  Body mass index is 37.25 kg/m.  Physical Exam Vitals signs and nursing note reviewed.  Constitutional:      Appearance: Normal appearance. She is well-developed.  HENT:     Head: Normocephalic and atraumatic.     Nose: Nose normal.  Eyes:     Extraocular Movements: Extraocular  movements intact.     Conjunctiva/sclera: Conjunctivae normal.     Pupils: Pupils are equal, round, and reactive to light.  Neck:     Musculoskeletal: Normal range of motion and neck supple.     Thyroid: No thyromegaly.     Vascular: No carotid bruit or JVD.     Trachea: No tracheal deviation.  Cardiovascular:     Rate and Rhythm: Normal rate and regular rhythm.     Pulses: Normal pulses.          Dorsalis pedis pulses are 2+ on the right side and 2+ on the left side.       Posterior tibial pulses are 2+ on the right side and 2+ on the left side.     Heart sounds: Normal heart sounds.  Pulmonary:     Effort: Pulmonary effort is normal.     Breath sounds: Normal breath sounds. No wheezing.  Chest:     Breasts:        Right: Normal. No swelling, bleeding, inverted nipple, mass, nipple discharge, skin change or tenderness.        Left: Normal. No swelling, bleeding, inverted nipple, mass, nipple discharge, skin change or tenderness.  Abdominal:     General: Bowel sounds are normal.     Palpations: Abdomen is soft.     Tenderness: There is no abdominal tenderness.  Musculoskeletal: Normal range of motion.  Feet:     Right foot:     Protective Sensation: 10 sites tested. 10 sites sensed.     Skin integrity: Skin integrity normal.     Left foot:     Protective Sensation: 10 sites tested. 10 sites sensed.     Skin integrity: Skin integrity normal.  Lymphadenopathy:     Cervical: No cervical adenopathy.  Skin:    General: Skin is warm and dry.  Neurological:     General: No focal deficit present.     Mental Status: She is alert and oriented to person, place, and time.  Psychiatric:        Behavior: Behavior normal.        Thought Content: Thought content normal.        Judgment: Judgment normal.    Depression screen [facility] 2/9 09/20/2018 02/22/2018 09/17/2017  Decreased Interest 0 0 0  Down, Depressed, Hopeless 0 0 0  PHQ - 2 Score 0 0 0    Functional Status Survey: Is the  patient deaf or have difficulty hearing?: No Does the patient have difficulty seeing, even when wearing glasses/contacts?: Yes Does the patient have difficulty concentrating, remembering, or making decisions?: No Does the patient have difficulty walking or climbing stairs?: No Does the patient have difficulty dressing or bathing?: No Does the patient have difficulty doing errands alone such as visiting a doctor's office or shopping?: No  MMSE - [place] Exam 09/20/2018  Orientation to time 5  Orientation to Place 5  Registration 3  Attention/ Calculation 5  Recall 3  Language- name 2 objects 2  Language- repeat 1  Language- follow 3 step command 3  Language- read & follow direction 1  Write a sentence 1  Copy design 1  Total score 30    Fall Risk  09/20/2018 02/22/2018 09/17/2017  Falls in the past year? 0 No No  Number falls in past yr: 0 - -  Injury with Fall? 0 - -      LABS: Recent Results (from the past 2160 hour(s))  Comprehensive metabolic panel     Status: Abnormal   Collection Time: 09/13/18  9:28 AM  Result Value Ref Range   Glucose 120 (H) 65 - 99 mg/dL   BUN 14 8 - 27 mg/dL   Creatinine, Ser 0.80 0.57 - 1.00 mg/dL   GFR calc non Af Amer 75 >59 mL/min/1.73   GFR calc Af Amer 87 >59 mL/min/1.73   BUN/Creatinine Ratio 18 12 - 28   Sodium 136 134 - 144 mmol/L   Potassium 4.4 3.5 - 5.2 mmol/L   Chloride 101 96 - 106 mmol/L   CO2 22 20 - 29 mmol/L   Calcium 8.9 8.7 - 10.3 mg/dL   Total Protein 6.9 6.0 - 8.5 g/dL   Albumin 4.0 3.6 - 4.8 g/dL    Comment:     **Effective September 27, 2018 Albumin reference**       interval will be changing to:              Age                Female          Female           0 -  7 days        3.6 - 4.9      3.6 - 4.9           8 - 30 days        3.4 - 4.7      3.4 - 4.7           1 -  6 month       3.7 - 4.8      3.7 - 4.8    7 months -  2 years       3.9 - 5.0      3.9 - 5.0           3 -  5 years       4.0 - 5.0      4.0  - 5.0           6 - 12 years       4.1 - 5.0      4.0 - 5.0          13 - 30 years       4.1 - 5.2      3.9 - 5.0          31 - 50 years       4.0 - 5.0      3.8 - 4.8          51 - 60 years       3.8 - 4.9      3.8 - 4.9          61 - 70 years       3.8 - 4.8      3.8 - 4.8          71 - 80 years       3.7 - 4.7  3.7 - 4.7          81 - 89 years       3.6 - 4.6      3.6 - 4.6              >89 years       3.5 - 4.6      3.5 - 4.6    Globulin, Total 2.9 1.5 - 4.5 g/dL   Albumin/Globulin Ratio 1.4 1.2 - 2.2   Bilirubin Total 0.3 0.0 - 1.2 mg/dL   Alkaline Phosphatase 66 39 - 117 IU/L   AST 12 0 - 40 IU/L   ALT 9 0 - 32 IU/L  CBC     Status: Abnormal   Collection Time: 09/13/18  9:28 AM  Result Value Ref Range   WBC 12.9 (H) 3.4 - 10.8 x10E3/uL   RBC 4.43 3.77 - 5.28 x10E6/uL   Hemoglobin 13.3 11.1 - 15.9 g/dL   Hematocrit 39.7 34.0 - 46.6 %   MCV 90 79 - 97 fL   MCH 30.0 26.6 - 33.0 pg   MCHC 33.5 31.5 - 35.7 g/dL   RDW 13.6 11.7 - 15.4 %    Comment:               **Please note reference interval change**   Platelets 429 150 - 450 x10E3/uL  Lipid Panel w/o Chol/HDL Ratio     Status: Abnormal   Collection Time: 09/13/18  9:28 AM  Result Value Ref Range   Cholesterol, Total 197 100 - 199 mg/dL   Triglycerides 152 (H) 0 - 149 mg/dL   HDL 37 (L) >39 mg/dL   VLDL Cholesterol Cal 30 5 - 40 mg/dL   LDL Calculated 130 (H) 0 - 99 mg/dL  T4, free     Status: None   Collection Time: 09/13/18  9:28 AM  Result Value Ref Range   Free T4 1.16 0.82 - 1.77 ng/dL  TSH     Status: None   Collection Time: 09/13/18  9:28 AM  Result Value Ref Range   TSH 3.330 0.450 - 4.500 uIU/mL  VITAMIN D 25 Hydroxy (Vit-D Deficiency, Fractures)     Status: Abnormal   Collection Time: 09/13/18  9:28 AM  Result Value Ref Range   Vit D, 25-Hydroxy 28.0 (L) 30.0 - 100.0 ng/mL    Comment: Vitamin D deficiency has been defined by the [place] and an Endocrine Society practice guideline as  a level of serum 25-OH vitamin D less than 20 ng/mL (1,2). The Endocrine Society went on to further define vitamin D insufficiency as a level between 21 and 29 ng/mL (2). 1. IOM (Institute of Medicine). 2010. Dietary reference    intakes for calcium and D. [place]: The    [employer]. 2. Holick MF, Binkley Delphi, Bischoff-Ferrari HA, et al.    Evaluation, treatment, and prevention of vitamin D    deficiency: an Endocrine Society clinical practice    guideline. JCEM. 2011 Jul; 96(7):1911-30.   POCT HgB A1C     Status: Abnormal   Collection Time: 09/20/18  9:14 AM  Result Value Ref Range   Hemoglobin A1C 6.3 (A) 4.0 - 5.6 %   HbA1c POC (<> result, manual entry)     HbA1c, POC (prediabetic range)     HbA1c, POC (controlled diabetic range)     Assessment/Plan:  1. Encounter for general adult medical examination with abnormal findings Annual health maintenance exam today.  2.  Impaired fasting glucose - POCT HgB A1C 6.3 today. Continue to control through diet and exercise. Recheck at next visit.   3. Essential (primary) hypertension Stable. Continue bp medication as prescribed.   4. Dysuria - UA/M w/rflx Culture, Routine   General Counseling: jeneane pieczynski understanding of the findings of todays visit and agrees with plan of treatment. I have discussed any further diagnostic evaluation that may be needed or ordered today. We also reviewed her medications today. she has been encouraged to call the office with any questions or concerns that should arise related to todays visit.    Counseling:  Hypertension Counseling:   The following hypertensive lifestyle modification were recommended and discussed:  1. Limiting alcohol intake to less than 1 oz/day of ethanol:(24 oz of beer or 8 oz of wine or 2 oz of 100-proof whiskey). 2. Take baby ASA 81 mg daily. 3. Importance of regular aerobic exercise and losing weight. 4. Reduce dietary saturated fat and cholesterol  intake for overall cardiovascular health. 5. Maintaining adequate dietary potassium, calcium, and magnesium intake. 6. Regular monitoring of the blood pressure. 7. Reduce sodium intake to less than 100 mmol/day (less than 2.3 gm of sodium or less than 6 gm of sodium choride)   This patient was seen by [place] with Dr Lavera Guise as a part of collaborative care agreement  Orders Placed This Encounter  Procedures  . UA/M w/rflx Culture, Routine  . POCT HgB A1C      Time spent: Nikolai, MD  Internal Medicine

## 2018-09-21 LAB — UA/M W/RFLX CULTURE, ROUTINE
BILIRUBIN UA: NEGATIVE
GLUCOSE, UA: NEGATIVE
Ketones, UA: NEGATIVE
Leukocytes, UA: NEGATIVE
Nitrite, UA: NEGATIVE
PH UA: 6.5 (ref 5.0–7.5)
Protein, UA: NEGATIVE
RBC, UA: NEGATIVE
Specific Gravity, UA: 1.015 (ref 1.005–1.030)
Urobilinogen, Ur: 0.2 mg/dL (ref 0.2–1.0)

## 2018-09-21 LAB — MICROSCOPIC EXAMINATION
CASTS: NONE SEEN /LPF
RBC MICROSCOPIC, UA: NONE SEEN /HPF (ref 0–2)

## 2018-09-22 ENCOUNTER — Ambulatory Visit
Admission: RE | Admit: 2018-09-22 | Discharge: 2018-09-22 | Disposition: A | Discharge status: Home / Self Care | Payer: Medicare Other | Source: Ambulatory Visit | Attending: Internal Medicine | Admitting: Internal Medicine

## 2018-09-22 DIAGNOSIS — Z1231 Encounter for screening mammogram for malignant neoplasm of breast: Secondary | ICD-10-CM | POA: Insufficient documentation

## 2018-11-22 ENCOUNTER — Other Ambulatory Visit: Payer: Self-pay

## 2018-11-22 MED ORDER — LISINOPRIL-HYDROCHLOROTHIAZIDE 20-12.5 MG PO TABS: 1.0000 | ORAL_TABLET | Freq: Every day | ORAL | 1 refills | Status: DC

## 2019-01-19 ENCOUNTER — Other Ambulatory Visit: Payer: Self-pay

## 2019-01-19 MED ORDER — CARVEDILOL 25 MG PO TABS: 25.0000 mg | ORAL_TABLET | Freq: Two times a day (BID) | ORAL | 2 refills | Status: DC

## 2019-03-22 ENCOUNTER — Encounter: Payer: Self-pay | Admitting: Nurse Practitioner

## 2019-03-22 ENCOUNTER — Ambulatory Visit: Payer: Medicare Other | Admitting: Nurse Practitioner

## 2019-03-22 ENCOUNTER — Other Ambulatory Visit: Payer: Self-pay

## 2019-03-22 VITALS — BP 137/63 | HR 59 | Ht 63.0 in | Wt 217.0 lb

## 2019-03-22 DIAGNOSIS — I10 Essential (primary) hypertension: Secondary | ICD-10-CM

## 2019-03-22 DIAGNOSIS — R7301 Impaired fasting glucose: Secondary | ICD-10-CM

## 2019-03-22 NOTE — Progress Notes (Signed)
[facility] [place], [place] [address]  Internal MEDICINE  Telephone Visit  Patient Name: Donna Beasley  [id number]  [id number]  Date of Service: 03/23/2019  I connected with the patient at 12:31pm by telephone and verified the patients identity using two identifiers.   I discussed the limitations, risks, security and privacy concerns of performing an evaluation and management service by telephone and the availability of in person appointments. I also discussed with the patient that there may be a patient responsible charge related to the service.  The patient expressed understanding and agrees to proceed.    Chief Complaint  Patient presents with  . Telephone Assessment  . Telephone Screen  . Hypertension  . Diabetes    The patient has been contacted via telephone for follow up visit due to concerns for spread of novel coronavirus. The patient is concerened aout recent weight gain. Has been very careful and has socially isolated herself since the beginning of pandemic, back in March. She had been going to the pool several times a week for exercise and has not done this since early March. Her poool is now open with strict rules regarding how many patrons can be present at one time. temperatures are also taken prior to entry. Visitors only welcome by appointment. She wants to go back, but afraid that precautions may not be enough. Long discussion about risks of spread of CoVID 19 spread with proper precautions taken to prevent further spread. Encouraged her to return to pool a few times a week for short intervals. Getting out of the house and being physically active would be good for both mental and physical health. Will also help to take weight gain off. She states that she is feeling well. Has no concerns or complaints today.   Hypertension This is a chronic problem. The current episode started more than 1 year ago. The problem is unchanged. The problem is  controlled. Associated symptoms include malaise/fatigue. Pertinent negatives include no chest pain, headaches, palpitations or shortness of breath. Agents associated with hypertension include estrogens. Risk factors for coronary artery disease include obesity and post-menopausal state. Past treatments include ACE inhibitors and diuretics. The current treatment provides moderate improvement. Compliance problems include exercise.        Current Medication: Outpatient Encounter Medications as of 03/22/2019  Medication Sig  . aspirin 81 MG tablet Take 81 mg by mouth daily.  . carvedilol (COREG) 25 MG tablet Take 1 tablet (25 mg total) by mouth 2 (two) times daily.  Marland Kitchen conjugated estrogens (PREMARIN) vaginal cream Place 1 Applicatorful vaginally once a week. At night.  . hydrocortisone cream 0.5 % Apply 1 application topically 2 (two) times daily as needed for itching.  Marland Kitchen lisinopril-hydrochlorothiazide (PRINZIDE,ZESTORETIC) 20-12.5 MG tablet Take 1 tablet by mouth daily.  . Multiple Vitamin (MULTIVITAMIN) tablet Take 1 tablet by mouth daily.  . Omega-3 Fatty Acids (FISH OIL PO) Take by mouth.  . triamcinolone cream (KENALOG) 0.1 % Apply 1 application topically 2 (two) times daily as needed.   No facility-administered encounter medications on file as of 03/22/2019.     Surgical History: Past Surgical History:  Procedure Laterality Date  . COLONOSCOPY WITH PROPOFOL N/A 07/02/2015   Procedure: COLONOSCOPY WITH PROPOFOL;  Surgeon: Lucilla Lame, MD;  Location: [facility];  Service: Endoscopy;  Laterality: N/A;  . POLYPECTOMY  07/02/2015   Procedure: POLYPECTOMY;  Surgeon: Lucilla Lame, MD;  Location: [facility];  Service: Endoscopy;;  . UTERINE FIBROID SURGERY  Medical History: Past Medical History:  Diagnosis Date  . Arthritis    osteo - hands, toes, knees  . Atrophic vaginitis   . Eczema   . GERD (gastroesophageal reflux disease)    occas.  . Headache    sinus  .  Hypertension   . Knee pain, right   . Motion sickness    boats (sometimes)  . Osteoarthritis   . Sinusitis     Family History: Family History  Problem Relation Age of Onset  . Hypertension Mother   . Thyroid disease Mother   . Hypertension Father   . Breast cancer Neg Hx     Social History   Socioeconomic History  . Marital status: Single    Spouse name: Not on file  . Number of children: Not on file  . Years of education: Not on file  . Highest education level: Not on file  Occupational History  . Not on file  Social Needs  . Financial resource strain: Not on file  . Food insecurity    Worry: Not on file    Inability: Not on file  . Transportation needs    Medical: Not on file    Non-medical: Not on file  Tobacco Use  . Smoking status: Current Some Day Smoker    Packs/day: 0.50    Types: Cigarettes  . Smokeless tobacco: Never Used  . Tobacco comment: may smoke 2 packs/wk. quit 06/25/15 for procedure  Substance and Sexual Activity  . Alcohol use: Yes    Comment: 4-5 drinks per year  . Drug use: Never  . Sexual activity: Not on file  Lifestyle  . Physical activity    Days per week: Not on file    Minutes per session: Not on file  . Stress: Not on file  Relationships  . Social Herbalist on phone: Not on file    Gets together: Not on file    Attends religious service: Not on file    Active member of club or organization: Not on file    Attends meetings of clubs or organizations: Not on file    Relationship status: Not on file  . Intimate partner violence    Fear of current or ex partner: Not on file    Emotionally abused: Not on file    Physically abused: Not on file    Forced sexual activity: Not on file  Other Topics Concern  . Not on file  Social History Narrative  . Not on file      Review of Systems  Constitutional: Positive for malaise/fatigue. Negative for fatigue and fever.  HENT: Negative for congestion, postnasal drip and  rhinorrhea.   Respiratory: Negative for cough, shortness of breath and wheezing.   Cardiovascular: Negative for chest pain and palpitations.       Home blood pressure readings have bben good. Generally running 811-914 systolic over 80 diastolic.  Gastrointestinal: Negative for constipation, diarrhea, nausea and vomiting.  Endocrine: Negative for cold intolerance, heat intolerance, polydipsia and polyuria.  Musculoskeletal: Negative for arthralgias, back pain and myalgias.  Skin: Negative for rash.  Allergic/Immunologic: Negative for environmental allergies.  Neurological: Negative for dizziness, weakness, light-headedness and headaches.  Hematological: Negative for adenopathy.  Psychiatric/Behavioral: The patient is not nervous/anxious.     Today's Vitals   03/22/19 1148  BP: 137/63  Pulse: (!) 59  Weight: 217 lb (98.4 kg)  Height: 5\' 3"  (1.6 m)   Body mass index is 38.44 kg/m.  Observation/Objective:   The patient is alert and oriented. She is pleasant and answers all questions appropriately. Breathing is non-labored. She is in no acute distress at this time.    Assessment/Plan: 1. Essential (primary) hypertension Stable. Continue bp medication as prescribed   2. Impaired fasting glucose Check fasting labs with HgbA1c prior to next visit. Manage as indicated.   General Counseling: atiyah bauer understanding of the findings of today's phone visit and agrees with plan of treatment. I have discussed any further diagnostic evaluation that may be needed or ordered today. We also reviewed her medications today. she has been encouraged to call the office with any questions or concerns that should arise related to todays visit.  Hypertension Counseling:   The following hypertensive lifestyle modification were recommended and discussed:  1. Limiting alcohol intake to less than 1 oz/day of ethanol:(24 oz of beer or 8 oz of wine or 2 oz of 100-proof whiskey). 2. Take baby ASA 81  mg daily. 3. Importance of regular aerobic exercise and losing weight. 4. Reduce dietary saturated fat and cholesterol intake for overall cardiovascular health. 5. Maintaining adequate dietary potassium, calcium, and magnesium intake. 6. Regular monitoring of the blood pressure. 7. Reduce sodium intake to less than 100 mmol/day (less than 2.3 gm of sodium or less than 6 gm of sodium choride)   This patient was seen by [place] with Dr Lavera Guise as a part of collaborative care agreement  Time spent: 25 Minutes    Dr Lavera Guise Internal medicine

## 2019-03-23 ENCOUNTER — Telehealth: Payer: Self-pay

## 2019-03-23 NOTE — Telephone Encounter (Signed)
Mailed pt lab slip per [employer].

## 2019-05-23 ENCOUNTER — Other Ambulatory Visit: Payer: Self-pay

## 2019-05-23 MED ORDER — LISINOPRIL-HYDROCHLOROTHIAZIDE 20-12.5 MG PO TABS: 1.0000 | ORAL_TABLET | Freq: Every day | ORAL | 1 refills | Status: DC

## 2019-09-23 ENCOUNTER — Telehealth: Payer: Self-pay

## 2019-09-23 NOTE — Telephone Encounter (Signed)
CONFIRMED 09-27-19 OV AS VIRTUAL. 

## 2019-09-27 ENCOUNTER — Encounter: Payer: Self-pay | Admitting: Nurse Practitioner

## 2019-09-27 ENCOUNTER — Ambulatory Visit (INDEPENDENT_AMBULATORY_CARE_PROVIDER_SITE_OTHER): Payer: Medicare PPO | Admitting: Nurse Practitioner

## 2019-09-27 VITALS — BP 146/66 | HR 70 | Temp 98.9°F | Ht 63.0 in

## 2019-09-27 DIAGNOSIS — Z1159 Encounter for screening for other viral diseases: Secondary | ICD-10-CM

## 2019-09-27 DIAGNOSIS — Z0001 Encounter for general adult medical examination with abnormal findings: Secondary | ICD-10-CM

## 2019-09-27 DIAGNOSIS — E119 Type 2 diabetes mellitus without complications: Secondary | ICD-10-CM

## 2019-09-27 DIAGNOSIS — I10 Essential (primary) hypertension: Secondary | ICD-10-CM | POA: Diagnosis not present

## 2019-09-27 NOTE — Progress Notes (Signed)
[facility] [place], [place] [address]  Internal MEDICINE  Telephone Visit  Patient Name: Donna Beasley  [id number]  [id number]  Date of Service: 09/27/2019  I connected with the patient at 10:23am by telephone and verified the patients identity using two identifiers.   I discussed the limitations, risks, security and privacy concerns of performing an evaluation and management service by telephone and the availability of in person appointments. I also discussed with the patient that there may be a patient responsible charge related to the service.  The patient expressed understanding and agrees to proceed.    Chief Complaint  Patient presents with  . Telephone Assessment  . Telephone Screen  . Medicare Wellness  . Hypertension    The patient has been contacted via telephone for annual wellness visit due to concerns for spread of novel coronavirus. The patient resents for health maintenance exam. The patient states that she is doing well, overall. She wishes to hold off on routine, fasting labs and mammogram until she is vaccinated for COVID 19. She asks that I send her a lab slip. She will call to make appointment for mammogram after she is vaccinated. She is doing well. She has no concerns or complaints today.       Current Medication: Outpatient Encounter Medications as of 09/27/2019  Medication Sig  . aspirin 81 MG tablet Take 81 mg by mouth daily.  . carvedilol (COREG) 25 MG tablet Take 1 tablet (25 mg total) by mouth 2 (two) times daily.  . hydrocortisone cream 0.5 % Apply 1 application topically 2 (two) times daily as needed for itching.  Marland Kitchen lisinopril-hydrochlorothiazide (ZESTORETIC) 20-12.5 MG tablet Take 1 tablet by mouth daily.  . Multiple Vitamin (MULTIVITAMIN) tablet Take 1 tablet by mouth daily.  Marland Kitchen triamcinolone cream (KENALOG) 0.1 % Apply 1 application topically 2 (two) times daily as needed.  . [DISCONTINUED] conjugated estrogens  (PREMARIN) vaginal cream Place 1 Applicatorful vaginally once a week. At night.  . [DISCONTINUED] Omega-3 Fatty Acids (FISH OIL PO) Take by mouth.   No facility-administered encounter medications on file as of 09/27/2019.    Surgical History: Past Surgical History:  Procedure Laterality Date  . COLONOSCOPY WITH PROPOFOL N/A 07/02/2015   Procedure: COLONOSCOPY WITH PROPOFOL;  Surgeon: Lucilla Lame, MD;  Location: [facility];  Service: Endoscopy;  Laterality: N/A;  . POLYPECTOMY  07/02/2015   Procedure: POLYPECTOMY;  Surgeon: Lucilla Lame, MD;  Location: [facility];  Service: Endoscopy;;  . UTERINE FIBROID SURGERY      Medical History: Past Medical History:  Diagnosis Date  . Arthritis    osteo - hands, toes, knees  . Atrophic vaginitis   . Eczema   . GERD (gastroesophageal reflux disease)    occas.  . Headache    sinus  . Hypertension   . Knee pain, right   . Motion sickness    boats (sometimes)  . Osteoarthritis   . Sinusitis     Family History: Family History  Problem Relation Age of Onset  . Hypertension Mother   . Thyroid disease Mother   . Hypertension Father   . Breast cancer Neg Hx     Social History   Socioeconomic History  . Marital status: Single    Spouse name: Not on file  . Number of children: Not on file  . Years of education: Not on file  . Highest education level: Not on file  Occupational History  . Not on file  Tobacco Use  . Smoking status: Current Some Day Smoker    Packs/day: 0.50    Types: Cigarettes  . Smokeless tobacco: Never Used  . Tobacco comment: may smoke 2 packs/wk. quit 06/25/15 for procedure  Substance and Sexual Activity  . Alcohol use: Yes    Comment: 4-5 drinks per year  . Drug use: Never  . Sexual activity: Not on file  Other Topics Concern  . Not on file  Social History Narrative  . Not on file   Social Determinants of Health   Financial Resource Strain:   . Difficulty of Paying Living  Expenses: Not on file  Food Insecurity:   . Worried About Charity fundraiser in the Last Year: Not on file  . Ran Out of Food in the Last Year: Not on file  Transportation Needs:   . Lack of Transportation (Medical): Not on file  . Lack of Transportation (Non-Medical): Not on file  Physical Activity:   . Days of Exercise per Week: Not on file  . Minutes of Exercise per Session: Not on file  Stress:   . Feeling of Stress : Not on file  Social Connections:   . Frequency of Communication with Friends and Family: Not on file  . Frequency of Social Gatherings with Friends and Family: Not on file  . Attends Religious Services: Not on file  . Active Member of Clubs or Organizations: Not on file  . Attends Archivist Meetings: Not on file  . Marital Status: Not on file  Intimate Partner Violence:   . Fear of Current or Ex-Partner: Not on file  . Emotionally Abused: Not on file  . Physically Abused: Not on file  . Sexually Abused: Not on file      Review of Systems  Constitutional: Negative for fatigue and fever.  HENT: Negative for congestion, postnasal drip, rhinorrhea and sore throat.   Respiratory: Negative for cough, shortness of breath and wheezing.   Cardiovascular: Negative for chest pain and palpitations.       Blood pressure taken about 1 hour after taking medications.   Gastrointestinal: Negative for constipation, diarrhea, nausea and vomiting.  Endocrine: Negative for cold intolerance, heat intolerance, polydipsia and polyuria.  Genitourinary: Negative for dysuria, hematuria and urgency.  Musculoskeletal: Negative for arthralgias, back pain and myalgias.  Skin: Negative for rash.  Allergic/Immunologic: Negative for environmental allergies.  Neurological: Negative for dizziness, weakness, light-headedness and headaches.  Hematological: Negative for adenopathy.  Psychiatric/Behavioral: Negative for dysphoric mood. The patient is not nervous/anxious.      Today's Vitals   09/27/19 1011  BP: (!) 146/66  Pulse: 70  Temp: 98.9 F (37.2 C)  Height: 5\' 3"  (1.6 m)   Body mass index is 38.44 kg/m.  Observation/Objective:   The patient is alert and oriented. She is pleasant and answers all questions appropriately. Breathing is non-labored. She is in no acute distress at this time.   Depression screen [facility] 2/9 09/27/2019 03/22/2019 09/20/2018 02/22/2018 09/17/2017  Decreased Interest 0 0 0 0 0  Down, Depressed, Hopeless 0 0 0 0 0  PHQ - 2 Score 0 0 0 0 0    Functional Status Survey: Is the patient deaf or have difficulty hearing?: No Does the patient have difficulty seeing, even when wearing glasses/contacts?: Yes Does the patient have difficulty concentrating, remembering, or making decisions?: No Does the patient have difficulty walking or climbing stairs?: Yes(BAD KNEES) Does the patient have difficulty dressing or bathing?: No Does the  patient have difficulty doing errands alone such as visiting a doctor's office or shopping?: No  MMSE - [place] Exam 09/27/2019 09/20/2018  Orientation to time 5 5  Orientation to Place 5 5  Registration 3 3  Attention/ Calculation 5 5  Recall 3 3  Language- name 2 objects 2 2  Language- repeat 1 1  Language- follow 3 step command 3 3  Language- read & follow direction 1 1  Write a sentence 1 1  Copy design 1 1  Total score 30 30    Fall Risk  09/27/2019 03/22/2019 09/20/2018 02/22/2018 09/17/2017  Falls in the past year? 0 0 0 No No  Number falls in past yr: - - 0 - -  Injury with Fall? - - 0 - -     Assessment/Plan:  1. Encounter for general adult medical examination with abnormal findings Annual health maintenance exam. Lab slip to be mailed to the patient to check routine, fasting labs, including HgbA1c   2. Essential (primary) hypertension Stable. conitnue bp medication as prescribed   3. Diet-controlled diabetes mellitus ([place]) Check HgbA1c with routine labs. Will do foot  exam at her next in-office visit.   4. Encounter for hepatitis C screening test for low risk patient Check hepatitis C screen with routine labs.    General Counseling: lynore dagher understanding of the findings of today's phone visit and agrees with plan of treatment. I have discussed any further diagnostic evaluation that may be needed or ordered today. We also reviewed her medications today. she has been encouraged to call the office with any questions or concerns that should arise related to todays visit.   Hypertension Counseling:   The following hypertensive lifestyle modification were recommended and discussed:  1. Limiting alcohol intake to less than 1 oz/day of ethanol:(24 oz of beer or 8 oz of wine or 2 oz of 100-proof whiskey). 2. Take baby ASA 81 mg daily. 3. Importance of regular aerobic exercise and losing weight. 4. Reduce dietary saturated fat and cholesterol intake for overall cardiovascular health. 5. Maintaining adequate dietary potassium, calcium, and magnesium intake. 6. Regular monitoring of the blood pressure. 7. Reduce sodium intake to less than 100 mmol/day (less than 2.3 gm of sodium or less than 6 gm of sodium choride)   This patient was seen by [place] with Dr Lavera Guise as a part of collaborative care agreement  Time spent: 30 Minutes  Time spent with patient included reviewing progress notes, labs, imaging studies, and discussing plan for follow up.   Dr Lavera Guise Internal medicine

## 2019-11-17 ENCOUNTER — Other Ambulatory Visit: Payer: Self-pay

## 2019-11-17 MED ORDER — LISINOPRIL-HYDROCHLOROTHIAZIDE 20-12.5 MG PO TABS: 1.0000 | ORAL_TABLET | Freq: Every day | ORAL | 1 refills | Status: DC

## 2019-12-13 ENCOUNTER — Other Ambulatory Visit: Payer: Self-pay | Admitting: Internal Medicine

## 2019-12-13 DIAGNOSIS — Z1231 Encounter for screening mammogram for malignant neoplasm of breast: Secondary | ICD-10-CM

## 2019-12-21 DIAGNOSIS — H40053 Ocular hypertension, bilateral: Secondary | ICD-10-CM | POA: Diagnosis not present

## 2020-01-02 ENCOUNTER — Ambulatory Visit
Admission: RE | Admit: 2020-01-02 | Discharge: 2020-01-02 | Disposition: A | Discharge status: Home / Self Care | Payer: Medicare PPO | Source: Ambulatory Visit | Attending: Internal Medicine | Admitting: Internal Medicine

## 2020-01-02 DIAGNOSIS — Z1231 Encounter for screening mammogram for malignant neoplasm of breast: Secondary | ICD-10-CM | POA: Insufficient documentation

## 2020-02-17 ENCOUNTER — Other Ambulatory Visit: Payer: Self-pay

## 2020-02-17 MED ORDER — CARVEDILOL 25 MG PO TABS: 25.0000 mg | ORAL_TABLET | Freq: Two times a day (BID) | ORAL | 2 refills | Status: DC

## 2020-03-16 ENCOUNTER — Other Ambulatory Visit: Payer: Self-pay | Admitting: Nurse Practitioner

## 2020-03-16 DIAGNOSIS — I10 Essential (primary) hypertension: Secondary | ICD-10-CM | POA: Diagnosis not present

## 2020-03-16 DIAGNOSIS — E559 Vitamin D deficiency, unspecified: Secondary | ICD-10-CM | POA: Diagnosis not present

## 2020-03-16 DIAGNOSIS — R7301 Impaired fasting glucose: Secondary | ICD-10-CM | POA: Diagnosis not present

## 2020-03-16 DIAGNOSIS — Z0001 Encounter for general adult medical examination with abnormal findings: Secondary | ICD-10-CM | POA: Diagnosis not present

## 2020-03-17 LAB — COMPREHENSIVE METABOLIC PANEL
ALT: 12 IU/L (ref 0–32)
AST: 14 IU/L (ref 0–40)
Albumin/Globulin Ratio: 1.1 — ABNORMAL LOW (ref 1.2–2.2)
Albumin: 3.8 g/dL (ref 3.8–4.8)
Alkaline Phosphatase: 72 IU/L (ref 48–121)
BUN/Creatinine Ratio: 12 (ref 12–28)
BUN: 10 mg/dL (ref 8–27)
Bilirubin Total: 0.3 mg/dL (ref 0.0–1.2)
CO2: 22 mmol/L (ref 20–29)
Calcium: 9.2 mg/dL (ref 8.7–10.3)
Chloride: 102 mmol/L (ref 96–106)
Creatinine, Ser: 0.84 mg/dL (ref 0.57–1.00)
GFR calc Af Amer: 81 mL/min/{1.73_m2} (ref >59)
GFR calc non Af Amer: 71 mL/min/{1.73_m2} (ref >59)
Globulin, Total: 3.5 g/dL (ref 1.5–4.5)
Glucose: 140 mg/dL — ABNORMAL HIGH (ref 65–99)
Potassium: 5 mmol/L (ref 3.5–5.2)
Sodium: 139 mmol/L (ref 134–144)
Total Protein: 7.3 g/dL (ref 6.0–8.5)

## 2020-03-17 LAB — LIPID PANEL W/O CHOL/HDL RATIO
Cholesterol, Total: 197 mg/dL (ref 100–199)
HDL: 32 mg/dL — ABNORMAL LOW (ref >39)
LDL Chol Calc (NIH): 133 mg/dL — ABNORMAL HIGH (ref 0–99)
Triglycerides: 177 mg/dL — ABNORMAL HIGH (ref 0–149)
VLDL Cholesterol Cal: 32 mg/dL (ref 5–40)

## 2020-03-17 LAB — HGB A1C W/O EAG: Hgb A1c MFr Bld: 6.8 % — ABNORMAL HIGH (ref 4.8–5.6)

## 2020-03-17 LAB — VITAMIN D 25 HYDROXY (VIT D DEFICIENCY, FRACTURES): Vit D, 25-Hydroxy: 32.3 ng/mL (ref 30.0–100.0)

## 2020-03-17 LAB — T4, FREE: Free T4: 1.23 ng/dL (ref 0.82–1.77)

## 2020-03-17 LAB — CBC
Hematocrit: 39.5 % (ref 34.0–46.6)
Hemoglobin: 13.7 g/dL (ref 11.1–15.9)
MCH: 30.2 pg (ref 26.6–33.0)
MCHC: 34.7 g/dL (ref 31.5–35.7)
MCV: 87 fL (ref 79–97)
Platelets: 439 10*3/uL (ref 150–450)
RBC: 4.54 x10E6/uL (ref 3.77–5.28)
RDW: 13.3 % (ref 11.7–15.4)
WBC: 14.3 10*3/uL — ABNORMAL HIGH (ref 3.4–10.8)

## 2020-03-17 LAB — TSH: TSH: 3.21 u[IU]/mL (ref 0.450–4.500)

## 2020-03-17 LAB — HEPATITIS C ANTIBODY: Hep C Virus Ab: <0.1 s/co ratio (ref 0.0–0.9)

## 2020-03-22 ENCOUNTER — Telehealth: Payer: Self-pay

## 2020-03-22 NOTE — Telephone Encounter (Signed)
Confirmed and screened for 03-26-20 ov.

## 2020-03-25 NOTE — Progress Notes (Signed)
Discuss labs with patient 03/26/2020

## 2020-03-26 ENCOUNTER — Ambulatory Visit: Payer: Medicare PPO | Admitting: Nurse Practitioner

## 2020-03-26 ENCOUNTER — Other Ambulatory Visit: Payer: Self-pay

## 2020-03-26 ENCOUNTER — Encounter: Payer: Self-pay | Admitting: Nurse Practitioner

## 2020-03-26 VITALS — BP 153/71 | HR 97 | Resp 16 | Ht 63.0 in | Wt 206.8 lb

## 2020-03-26 DIAGNOSIS — I10 Essential (primary) hypertension: Secondary | ICD-10-CM | POA: Diagnosis not present

## 2020-03-26 DIAGNOSIS — E119 Type 2 diabetes mellitus without complications: Secondary | ICD-10-CM | POA: Diagnosis not present

## 2020-03-26 DIAGNOSIS — D72829 Elevated white blood cell count, unspecified: Secondary | ICD-10-CM | POA: Diagnosis not present

## 2020-03-26 MED ORDER — AMOXICILLIN 875 MG PO TABS: 875.0000 mg | ORAL_TABLET | Freq: Two times a day (BID) | ORAL | 0 refills | Status: DC

## 2020-03-26 NOTE — Progress Notes (Signed)
[facility] [place], [place] [address]  Internal MEDICINE  Office Visit Note  Patient Name: Donna Beasley  [id number]  [id number]  Date of Service: 04/16/2020  Chief Complaint  Patient presents with  . Follow-up  . Gastroesophageal Reflux  . Hypertension  . Quality Metric Gaps    TDAP    The patient is here for follow up visit. She states that she has been feeling well and has no concerns or complaints at this time. She has had routine, fating labs done prior to this visit. She did have elevated WBC at 14.3. she denies fatigue, headache, or other evidence of infection at this time. Her glucose was a little elevated at 140 with HgbA1c at 6.8. her lipid panel showed mild elevation of LDL and triglycerides. She had screening mammogram done 01/02/2020 and it was negative. Her blood pressure is mildly elevated upon arrival at the office. She states that she monitors her blood pressure at home, routinely. It generally runs about 130/80.       Current Medication: Outpatient Encounter Medications as of 03/26/2020  Medication Sig  . aspirin 81 MG tablet Take 81 mg by mouth daily.  . carvedilol (COREG) 25 MG tablet Take 1 tablet (25 mg total) by mouth 2 (two) times daily.  . hydrocortisone cream 0.5 % Apply 1 application topically 2 (two) times daily as needed for itching.  Marland Kitchen lisinopril-hydrochlorothiazide (ZESTORETIC) 20-12.5 MG tablet Take 1 tablet by mouth daily.  . Multiple Vitamin (MULTIVITAMIN) tablet Take 1 tablet by mouth daily.  Marland Kitchen triamcinolone cream (KENALOG) 0.1 % Apply 1 application topically 2 (two) times daily as needed.  Marland Kitchen amoxicillin (AMOXIL) 875 MG tablet Take 1 tablet (875 mg total) by mouth 2 (two) times daily.   No facility-administered encounter medications on file as of 03/26/2020.    Surgical History: Past Surgical History:  Procedure Laterality Date  . COLONOSCOPY WITH PROPOFOL N/A 07/02/2015   Procedure: COLONOSCOPY WITH PROPOFOL;   Surgeon: Lucilla Lame, MD;  Location: [facility];  Service: Endoscopy;  Laterality: N/A;  . POLYPECTOMY  07/02/2015   Procedure: POLYPECTOMY;  Surgeon: Lucilla Lame, MD;  Location: [facility];  Service: Endoscopy;;  . UTERINE FIBROID SURGERY      Medical History: Past Medical History:  Diagnosis Date  . Arthritis    osteo - hands, toes, knees  . Atrophic vaginitis   . Eczema   . GERD (gastroesophageal reflux disease)    occas.  . Headache    sinus  . Hypertension   . Knee pain, right   . Motion sickness    boats (sometimes)  . Osteoarthritis   . Sinusitis     Family History: Family History  Problem Relation Age of Onset  . Hypertension Mother   . Thyroid disease Mother   . Hypertension Father   . Breast cancer Neg Hx     Social History   Socioeconomic History  . Marital status: Single    Spouse name: Not on file  . Number of children: Not on file  . Years of education: Not on file  . Highest education level: Not on file  Occupational History  . Not on file  Tobacco Use  . Smoking status: Current Some Day Smoker    Packs/day: 0.50    Types: Cigarettes  . Smokeless tobacco: Never Used  . Tobacco comment: may smoke 2 packs/wk. quit 06/25/15 for procedure  Substance and Sexual Activity  . Alcohol use: Not Currently  .  Drug use: Never  . Sexual activity: Not on file  Other Topics Concern  . Not on file  Social History Narrative  . Not on file   Social Determinants of Health   Financial Resource Strain:   . Difficulty of Paying Living Expenses:   Food Insecurity:   . Worried About Charity fundraiser in the Last Year:   . Arboriculturist in the Last Year:   Transportation Needs:   . Film/video editor (Medical):   Marland Kitchen Lack of Transportation (Non-Medical):   Physical Activity:   . Days of Exercise per Week:   . Minutes of Exercise per Session:   Stress:   . Feeling of Stress :   Social Connections:   . Frequency of Communication  with Friends and Family:   . Frequency of Social Gatherings with Friends and Family:   . Attends Religious Services:   . Active Member of Clubs or Organizations:   . Attends Archivist Meetings:   Marland Kitchen Marital Status:   Intimate Partner Violence:   . Fear of Current or Ex-Partner:   . Emotionally Abused:   Marland Kitchen Physically Abused:   . Sexually Abused:       Review of Systems  Constitutional: Negative for activity change, fatigue and fever.  HENT: Negative for congestion, postnasal drip, rhinorrhea and sore throat.   Respiratory: Negative for cough, shortness of breath and wheezing.   Cardiovascular: Negative for chest pain and palpitations.       Mildly elevated blood pressure today.   Gastrointestinal: Negative for constipation, diarrhea, nausea and vomiting.  Endocrine: Negative for cold intolerance, heat intolerance, polydipsia and polyuria.  Genitourinary: Negative for hematuria and urgency.  Musculoskeletal: Negative for arthralgias, back pain and myalgias.  Skin: Negative for rash.  Allergic/Immunologic: Negative for environmental allergies.  Neurological: Negative for dizziness, weakness, light-headedness and headaches.  Hematological: Negative for adenopathy.  Psychiatric/Behavioral: Negative for dysphoric mood. The patient is not nervous/anxious.     Today's Vitals   03/26/20 1129  BP: (!) 153/71  Pulse: 97  Resp: 16  SpO2: 94%  Weight: 206 lb 12.8 oz (93.8 kg)  Height: 5\' 3"  (1.6 m)   Body mass index is 36.63 kg/m.  Physical Exam Vitals and nursing note reviewed.  Constitutional:      General: She is not in acute distress.    Appearance: Normal appearance. She is well-developed. She is obese. She is not diaphoretic.  HENT:     Head: Normocephalic and atraumatic.     Nose: Nose normal.     Mouth/Throat:     Pharynx: No oropharyngeal exudate.  Eyes:     Pupils: Pupils are equal, round, and reactive to light.  Neck:     Thyroid: No thyromegaly.      Vascular: No carotid bruit or JVD.     Trachea: No tracheal deviation.  Cardiovascular:     Rate and Rhythm: Normal rate and regular rhythm.     Heart sounds: Normal heart sounds. No murmur heard.  No friction rub. No gallop.   Pulmonary:     Effort: Pulmonary effort is normal. No respiratory distress.     Breath sounds: Normal breath sounds. No wheezing or rales.  Chest:     Chest wall: No tenderness.  Abdominal:     Palpations: Abdomen is soft.  Musculoskeletal:        General: Normal range of motion.     Cervical back: Normal range of motion and neck  supple.  Lymphadenopathy:     Cervical: No cervical adenopathy.  Skin:    General: Skin is warm and dry.  Neurological:     General: No focal deficit present.     Mental Status: She is alert and oriented to person, place, and time.     Cranial Nerves: No cranial nerve deficit.  Psychiatric:        Mood and Affect: Mood normal.        Behavior: Behavior normal.        Thought Content: Thought content normal.        Judgment: Judgment normal.    Assessment/Plan: 1. Leukocytosis, unspecified type WBC elevated on recent labs at 14.3 with unclear cause. Start amoxicillin 875mg  twice daily for next 7 days. Will recheck CBC after completion of antibiotics and treat further as indicated.  - amoxicillin (AMOXIL) 875 MG tablet; Take 1 tablet (875 mg total) by mouth 2 (two) times daily.  Dispense: 14 tablet; Refill: 0  2. Essential (primary) hypertension Generally stable. Continue bp medication as prescribed   3. Diet-controlled diabetes mellitus ([place]) Recent HgbA1c 6.8. she should conitnue to control through diet and exercise. Recheck HgbA1c at her next visit.   General Counseling: roxan yamamoto understanding of the findings of todays visit and agrees with plan of treatment. I have discussed any further diagnostic evaluation that may be needed or ordered today. We also reviewed her medications today. she has been encouraged to call  the office with any questions or concerns that should arise related to todays visit.   This patient was seen by [place] with Dr Lavera Guise as a part of collaborative care agreement  Meds ordered this encounter  Medications  . amoxicillin (AMOXIL) 875 MG tablet    Sig: Take 1 tablet (875 mg total) by mouth 2 (two) times daily.    Dispense:  14 tablet    Refill:  0    Order Specific Question:   Supervising Provider    Answer:   Lavera Guise [[id number]]    Total time spent: 30 Minutes   Time spent includes review of chart, medications, test results, and follow up plan with the patient.      Dr Lavera Guise Internal medicine

## 2020-04-06 ENCOUNTER — Other Ambulatory Visit: Payer: Self-pay | Admitting: Nurse Practitioner

## 2020-04-06 DIAGNOSIS — D72829 Elevated white blood cell count, unspecified: Secondary | ICD-10-CM | POA: Diagnosis not present

## 2020-04-07 LAB — CBC
Hematocrit: 41.7 % (ref 34.0–46.6)
Hemoglobin: 13.8 g/dL (ref 11.1–15.9)
MCH: 29.9 pg (ref 26.6–33.0)
MCHC: 33.1 g/dL (ref 31.5–35.7)
MCV: 90 fL (ref 79–97)
Platelets: 426 10*3/uL (ref 150–450)
RBC: 4.62 x10E6/uL (ref 3.77–5.28)
RDW: 13.5 % (ref 11.7–15.4)
WBC: 12.8 10*3/uL — ABNORMAL HIGH (ref 3.4–10.8)

## 2020-05-10 ENCOUNTER — Other Ambulatory Visit: Payer: Self-pay

## 2020-05-10 MED ORDER — LISINOPRIL-HYDROCHLOROTHIAZIDE 20-12.5 MG PO TABS: 1.0000 | ORAL_TABLET | Freq: Every day | ORAL | 1 refills | Status: DC

## 2020-09-10 ENCOUNTER — Other Ambulatory Visit: Payer: Self-pay

## 2020-09-10 MED ORDER — LISINOPRIL-HYDROCHLOROTHIAZIDE 20-12.5 MG PO TABS: 1.0000 | ORAL_TABLET | Freq: Every day | ORAL | 1 refills | Status: DC

## 2020-09-27 ENCOUNTER — Encounter: Payer: Self-pay | Admitting: Hospice and Palliative Medicine

## 2020-09-27 ENCOUNTER — Ambulatory Visit (INDEPENDENT_AMBULATORY_CARE_PROVIDER_SITE_OTHER): Payer: Medicare PPO | Admitting: Hospice and Palliative Medicine

## 2020-09-27 VITALS — BP 132/78 | HR 76 | Temp 97.2°F | Resp 16 | Ht 63.0 in | Wt 200.8 lb

## 2020-09-27 DIAGNOSIS — I10 Essential (primary) hypertension: Secondary | ICD-10-CM

## 2020-09-27 DIAGNOSIS — E782 Mixed hyperlipidemia: Secondary | ICD-10-CM

## 2020-09-27 DIAGNOSIS — E119 Type 2 diabetes mellitus without complications: Secondary | ICD-10-CM | POA: Diagnosis not present

## 2020-09-27 DIAGNOSIS — R7301 Impaired fasting glucose: Secondary | ICD-10-CM

## 2020-09-27 DIAGNOSIS — R300 Dysuria: Secondary | ICD-10-CM

## 2020-09-27 DIAGNOSIS — Z0001 Encounter for general adult medical examination with abnormal findings: Secondary | ICD-10-CM | POA: Diagnosis not present

## 2020-09-27 NOTE — Progress Notes (Signed)
[facility] [place], [place] [address]  Internal MEDICINE  Office Visit Note  Patient Name: Donna Beasley  [id number]  [id number]  Date of Service: 10/01/2020  Chief Complaint  Patient presents with  . Medicare Wellness  . Gastroesophageal Reflux  . Hypertension     HPI Pt is here for routine health maintenance examination Overall things have been going well--no complaints today Reviewed labs from July--few abnormalities, elevated A1C and fasting glucose, lipid panel abnormal Discussed her elevated A1C--plan in July was for monitoring and she has been trying to avoid carbs and sweets  Denies recent changes in her appetite or bowel habits, sleeping well at night  PHM: Mammogram completed 04/21-normal, will need to be scheduled again in April Colonoscopy-last completed 2016-she was told GI office would contact her when it was time to schedule another screening--will need follow-up, appears colonoscopy was abnormal and needed repeating in 5 years  Current Medication: Outpatient Encounter Medications as of 09/27/2020  Medication Sig  . aspirin 81 MG tablet Take 81 mg by mouth daily.  . carvedilol (COREG) 25 MG tablet Take 1 tablet (25 mg total) by mouth 2 (two) times daily.  . hydrocortisone cream 0.5 % Apply 1 application topically 2 (two) times daily as needed for itching.  Marland Kitchen lisinopril-hydrochlorothiazide (ZESTORETIC) 20-12.5 MG tablet Take 1 tablet by mouth daily.  . Multiple Vitamin (MULTIVITAMIN) tablet Take 1 tablet by mouth daily.  Marland Kitchen triamcinolone cream (KENALOG) 0.1 % Apply 1 application topically 2 (two) times daily as needed.  . [DISCONTINUED] amoxicillin (AMOXIL) 875 MG tablet Take 1 tablet (875 mg total) by mouth 2 (two) times daily.   No facility-administered encounter medications on file as of 09/27/2020.    Surgical History: Past Surgical History:  Procedure Laterality Date  . COLONOSCOPY WITH PROPOFOL N/A 07/02/2015   Procedure:  COLONOSCOPY WITH PROPOFOL;  Surgeon: Lucilla Lame, MD;  Location: [facility];  Service: Endoscopy;  Laterality: N/A;  . POLYPECTOMY  07/02/2015   Procedure: POLYPECTOMY;  Surgeon: Lucilla Lame, MD;  Location: [facility];  Service: Endoscopy;;  . UTERINE FIBROID SURGERY      Medical History: Past Medical History:  Diagnosis Date  . Arthritis    osteo - hands, toes, knees  . Atrophic vaginitis   . Eczema   . GERD (gastroesophageal reflux disease)    occas.  . Headache    sinus  . Hypertension   . Knee pain, right   . Motion sickness    boats (sometimes)  . Osteoarthritis   . Sinusitis     Family History: Family History  Problem Relation Age of Onset  . Hypertension Mother   . Thyroid disease Mother   . Hypertension Father   . Breast cancer Neg Hx       Review of Systems  Constitutional: Negative for chills, diaphoresis and fatigue.  HENT: Negative for ear pain, postnasal drip and sinus pressure.   Eyes: Negative for photophobia, discharge, redness, itching and visual disturbance.  Respiratory: Negative for cough, shortness of breath and wheezing.   Cardiovascular: Negative for chest pain, palpitations and leg swelling.  Gastrointestinal: Negative for abdominal pain, constipation, diarrhea, nausea and vomiting.  Genitourinary: Negative for dysuria and flank pain.  Musculoskeletal: Negative for arthralgias, back pain, gait problem and neck pain.  Skin: Negative for color change.  Allergic/Immunologic: Negative for environmental allergies and food allergies.  Neurological: Negative for dizziness and headaches.  Hematological: Does not bruise/bleed easily.  Psychiatric/Behavioral: Negative for agitation,  behavioral problems (depression) and hallucinations.     Vital Signs: BP 132/78   Pulse 76   Temp (!) 97.2 F (36.2 C)   Resp 16   Ht 5\' 3"  (1.6 m)   Wt 200 lb 12.8 oz (91.1 kg)   SpO2 97%   BMI 35.57 kg/m    Physical Exam Vitals reviewed.   Constitutional:      Appearance: Normal appearance. She is obese.  HENT:     Nose: Nose normal.     Mouth/Throat:     Mouth: Mucous membranes are moist.     Pharynx: Oropharynx is clear.  Eyes:     Pupils: Pupils are equal, round, and reactive to light.  Cardiovascular:     Rate and Rhythm: Normal rate and regular rhythm.     Pulses: Normal pulses.     Heart sounds: Normal heart sounds.  Pulmonary:     Effort: Pulmonary effort is normal.     Breath sounds: Normal breath sounds.  Chest:  Breasts:     Right: Normal.     Left: Normal.    Abdominal:     General: Abdomen is flat.     Palpations: Abdomen is soft.  Musculoskeletal:        General: Normal range of motion.     Cervical back: Normal range of motion.  Skin:    General: Skin is warm.  Neurological:     General: No focal deficit present.     Mental Status: She is alert and oriented to person, place, and time. Mental status is at baseline.  Psychiatric:        Mood and Affect: Mood normal.        Behavior: Behavior normal.        Thought Content: Thought content normal.        Judgment: Judgment normal.      LABS: Recent Results (from the past 2160 hour(s))  UA/M w/rflx Culture, Routine     Status: None   Collection Time: 09/25/20  8:28 AM   Specimen: Urine   Urine  Result Value Ref Range   Specific Gravity, UA CANCELED     Comment: Test not performed due to the age of this specimen.  Result canceled by the ancillary.    pH, UA CANCELED     Comment: Test not performed  Result canceled by the ancillary.    Protein,UA CANCELED     Comment: Test not performed  Result canceled by the ancillary.    Glucose, UA CANCELED     Comment: Test not performed  Result canceled by the ancillary.    Ketones, UA CANCELED     Comment: Test not performed  Result canceled by the ancillary.     Assessment/Plan: 1. Encounter for routine adult health examination with abnormal findings Well appearing 70 year  old female--will review updated labs and adjust plan of care as indicated Mammogram repeat in April Colonoscopy--will contact GI and clarify when screening needs to be repeated - CBC w/Diff/Platelet - Comprehensive Metabolic Panel (CMET) - Lipid Panel With LDL/HDL Ratio - TSH + free T4  2. Diet-controlled diabetes mellitus ([place]) Last A1C uncontrolled at 6.8--will recheck and initiate therapy as indicated Will need to discuss foot exam, eye exam, vascular screenings - HgB A1c  3. Essential hypertension BP and HR well controlled on current therapy, continue monitoring on current therapy - CBC w/Diff/Platelet - Comprehensive Metabolic Panel (CMET) - TSH + free T4  4. Mixed hyperlipidemia Repeat labs--will  initiate therapy as indicated Risk factors include HTN and T2DM - Lipid Panel With LDL/HDL Ratio  5. Dysuria - UA/M w/rflx Culture, Routine  General Counseling: danilee levie understanding of the findings of todays visit and agrees with plan of treatment. I have discussed any further diagnostic evaluation that may be needed or ordered today. We also reviewed her medications today. she has been encouraged to call the office with any questions or concerns that should arise related to todays visit.    Counseling:    Orders Placed This Encounter  Procedures  . UA/M w/rflx Culture, Routine  . CBC w/Diff/Platelet  . Comprehensive Metabolic Panel (CMET)  . Lipid Panel With LDL/HDL Ratio  . TSH + free T4  . HgB A1c      Total time spent: 30 Minutes  Time spent includes review of chart, medications, test results, and follow up plan with the patient.   This patient was seen by Casey Burkitt AGNP-C Collaboration with Dr Lavera Guise as a part of collaborative care agreement   Tanna Furry. [facility] Internal Medicine

## 2020-09-29 LAB — UA/M W/RFLX CULTURE, ROUTINE

## 2020-10-01 ENCOUNTER — Encounter: Payer: Self-pay | Admitting: Hospice and Palliative Medicine

## 2020-10-11 ENCOUNTER — Telehealth: Payer: Self-pay

## 2020-10-11 NOTE — Telephone Encounter (Signed)
Referral done for GI

## 2020-10-29 ENCOUNTER — Telehealth: Payer: Self-pay

## 2020-10-31 ENCOUNTER — Telehealth (INDEPENDENT_AMBULATORY_CARE_PROVIDER_SITE_OTHER): Payer: Self-pay | Admitting: Gastroenterology

## 2020-10-31 ENCOUNTER — Other Ambulatory Visit: Payer: Self-pay

## 2020-10-31 DIAGNOSIS — Z86010 Personal history of colonic polyps: Secondary | ICD-10-CM

## 2020-10-31 MED ORDER — NA SULFATE-K SULFATE-MG SULF 17.5-3.13-1.6 GM/177ML PO SOLN: 354.0000 mL | Freq: Once | ORAL | 0 refills | Status: AC

## 2020-10-31 NOTE — Progress Notes (Signed)
Gastroenterology Pre-Procedure Review  Request Date: 11/19/2020 Requesting Physician: Dr. Allen Norris   PATIENT REVIEW QUESTIONS: The patient responded to the following health history questions as indicated:    1. Are you having any GI issues? no 2. Do you have a personal history of Polyps? Yes 5 years  3. Do you have a family history of Colon Cancer or Polyps?Sister had polyps  4. Diabetes Mellitus? no 5. Joint replacements in the past 12 months?no 6. Major health problems in the past 3 months?no 7. Any artificial heart valves, MVP, or defibrillator?no    MEDICATIONS & ALLERGIES:    Patient reports the following regarding taking any anticoagulation/antiplatelet therapy:   Plavix, Coumadin, Eliquis, Xarelto, Lovenox, Pradaxa, Brilinta, or Effient? no Aspirin? No   Patient confirms/reports the following medications:  Current Outpatient Medications  Medication Sig Dispense Refill  . aspirin 81 MG tablet Take 81 mg by mouth daily.    . carvedilol (COREG) 25 MG tablet Take 1 tablet (25 mg total) by mouth 2 (two) times daily. 180 tablet 2  . hydrocortisone cream 0.5 % Apply 1 application topically 2 (two) times daily as needed for itching.    Marland Kitchen lisinopril-hydrochlorothiazide (ZESTORETIC) 20-12.5 MG tablet Take 1 tablet by mouth daily. 90 tablet 1  . Multiple Vitamin (MULTIVITAMIN) tablet Take 1 tablet by mouth daily.    Marland Kitchen triamcinolone cream (KENALOG) 0.1 % Apply 1 application topically 2 (two) times daily as needed.     No current facility-administered medications for this visit.    Patient confirms/reports the following allergies:  No Known Allergies  No orders of the defined types were placed in this encounter.   AUTHORIZATION INFORMATION Primary Insurance: 1D#: Group #:  Secondary Insurance: 1D#: Group #:  SCHEDULE INFORMATION: Date:  Time: Location:

## 2020-10-31 NOTE — Telephone Encounter (Signed)
ERROR

## 2020-11-08 ENCOUNTER — Encounter: Payer: Self-pay | Admitting: Gastroenterology

## 2020-11-15 ENCOUNTER — Other Ambulatory Visit: Payer: Self-pay

## 2020-11-15 ENCOUNTER — Other Ambulatory Visit
Admission: RE | Admit: 2020-11-15 | Discharge: 2020-11-15 | Disposition: A | Discharge status: Home / Self Care | Payer: Medicare PPO | Source: Ambulatory Visit | Attending: Gastroenterology | Admitting: Gastroenterology

## 2020-11-15 DIAGNOSIS — Z01812 Encounter for preprocedural laboratory examination: Secondary | ICD-10-CM | POA: Insufficient documentation

## 2020-11-15 DIAGNOSIS — Z20822 Contact with and (suspected) exposure to covid-19: Secondary | ICD-10-CM | POA: Diagnosis not present

## 2020-11-15 LAB — SARS CORONAVIRUS 2 (TAT 6-24 HRS): SARS Coronavirus 2: NEGATIVE

## 2020-11-15 NOTE — Discharge Instructions (Signed)

## 2020-11-19 ENCOUNTER — Ambulatory Visit: Payer: Medicare PPO | Admitting: Anesthesiology

## 2020-11-19 ENCOUNTER — Encounter: Payer: Self-pay | Admitting: Gastroenterology

## 2020-11-19 ENCOUNTER — Other Ambulatory Visit: Payer: Self-pay

## 2020-11-19 ENCOUNTER — Other Ambulatory Visit: Payer: Self-pay | Admitting: Nurse Practitioner

## 2020-11-19 ENCOUNTER — Encounter
Admission: RE | Disposition: A | Discharge status: Home / Self Care | Payer: Self-pay | Source: Home / Self Care | Attending: Gastroenterology

## 2020-11-19 ENCOUNTER — Ambulatory Visit
Admission: RE | Admit: 2020-11-19 | Discharge: 2020-11-19 | Disposition: A | Discharge status: Home / Self Care | Payer: Medicare PPO | Attending: Gastroenterology | Admitting: Gastroenterology

## 2020-11-19 DIAGNOSIS — F17210 Nicotine dependence, cigarettes, uncomplicated: Secondary | ICD-10-CM | POA: Diagnosis not present

## 2020-11-19 DIAGNOSIS — Z1211 Encounter for screening for malignant neoplasm of colon: Secondary | ICD-10-CM | POA: Insufficient documentation

## 2020-11-19 DIAGNOSIS — K635 Polyp of colon: Secondary | ICD-10-CM | POA: Diagnosis not present

## 2020-11-19 DIAGNOSIS — Z79899 Other long term (current) drug therapy: Secondary | ICD-10-CM | POA: Insufficient documentation

## 2020-11-19 DIAGNOSIS — K640 First degree hemorrhoids: Secondary | ICD-10-CM | POA: Insufficient documentation

## 2020-11-19 DIAGNOSIS — K529 Noninfective gastroenteritis and colitis, unspecified: Secondary | ICD-10-CM | POA: Insufficient documentation

## 2020-11-19 DIAGNOSIS — Z860100 Personal history of colon polyps, unspecified: Secondary | ICD-10-CM

## 2020-11-19 HISTORY — PX: COLONOSCOPY WITH PROPOFOL: SHX5780

## 2020-11-19 HISTORY — PX: POLYPECTOMY: SHX5525

## 2020-11-19 HISTORY — PX: BIOPSY: SHX5522

## 2020-11-19 SURGERY — COLONOSCOPY WITH PROPOFOL
Anesthesia: General | Site: Rectum

## 2020-11-19 MED ORDER — ONDANSETRON HCL 4 MG/2ML IJ SOLN: 4.0000 mg | Freq: Once | INTRAMUSCULAR | Status: DC | PRN

## 2020-11-19 MED ORDER — PROPOFOL 10 MG/ML IV BOLUS
INTRAVENOUS | Status: DC | PRN
  Administered 2020-11-19: 20 mg via INTRAVENOUS
  Administered 2020-11-19: 50 mg via INTRAVENOUS
  Administered 2020-11-19: 20 mg via INTRAVENOUS
  Administered 2020-11-19: 30 mg via INTRAVENOUS
  Administered 2020-11-19: 20 mg via INTRAVENOUS
  Administered 2020-11-19: 30 mg via INTRAVENOUS

## 2020-11-19 MED ORDER — LIDOCAINE HCL (CARDIAC) PF 100 MG/5ML IV SOSY
PREFILLED_SYRINGE | INTRAVENOUS | Status: DC | PRN
  Administered 2020-11-19: 60 mg via INTRAVENOUS

## 2020-11-19 MED ORDER — LACTATED RINGERS IV SOLN: INTRAVENOUS | Status: DC | PRN

## 2020-11-19 MED ORDER — ACETAMINOPHEN 325 MG PO TABS
325.0000 mg | ORAL_TABLET | ORAL | Status: DC | PRN
Start: 2020-11-19 — End: 2020-11-19

## 2020-11-19 MED ORDER — LACTATED RINGERS IV SOLN: INTRAVENOUS | Status: DC

## 2020-11-19 MED ORDER — STERILE WATER FOR IRRIGATION IR SOLN
Status: DC | PRN
  Administered 2020-11-19: 75 mL

## 2020-11-19 MED ORDER — ACETAMINOPHEN 160 MG/5ML PO SOLN: 325.0000 mg | ORAL | Status: DC | PRN

## 2020-11-19 SURGICAL SUPPLY — 22 items
CLIP HMST 235XBRD CATH ROT (MISCELLANEOUS) IMPLANT
CLIP RESOLUTION 360 11X235 (MISCELLANEOUS)
ELECT REM PT RETURN 9FT ADLT (ELECTROSURGICAL)
ELECTRODE REM PT RTRN 9FT ADLT (ELECTROSURGICAL) IMPLANT
FORCEPS BIOP RAD 4 LRG CAP 4 (CUTTING FORCEPS) ×2 IMPLANT
GOWN CVR UNV OPN BCK APRN NK (MISCELLANEOUS) ×2 IMPLANT
GOWN ISOL THUMB LOOP REG UNIV (MISCELLANEOUS) ×4
INJECTOR VARIJECT VIN23 (MISCELLANEOUS) IMPLANT
KIT DEFENDO VALVE AND CONN (KITS) IMPLANT
KIT PRC NS LF DISP ENDO (KITS) ×1 IMPLANT
KIT PROCEDURE OLYMPUS (KITS) ×2
MANIFOLD NEPTUNE II (INSTRUMENTS) ×2 IMPLANT
MARKER SPOT ENDO TATTOO 5ML (MISCELLANEOUS) IMPLANT
PROBE APC STR FIRE (PROBE) IMPLANT
RETRIEVER NET ROTH 2.5X230 LF (MISCELLANEOUS) IMPLANT
SNARE COLD EXACTO (MISCELLANEOUS) IMPLANT
SNARE SHORT THROW 13M SML OVAL (MISCELLANEOUS) ×2 IMPLANT
SNARE SNG USE RND 15MM (INSTRUMENTS) IMPLANT
SPOT EX ENDOSCOPIC TATTOO (MISCELLANEOUS)
TRAP ETRAP POLY (MISCELLANEOUS) ×2 IMPLANT
VARIJECT INJECTOR VIN23 (MISCELLANEOUS)
WATER STERILE IRR 250ML POUR (IV SOLUTION) ×2 IMPLANT

## 2020-11-19 NOTE — Transfer of Care (Signed)
Immediate Anesthesia Transfer of Care Note  Patient: Donna Beasley  Procedure(s) Performed: COLONOSCOPY WITH PROPOFOL (N/A Rectum) BIOPSY (N/A Rectum) POLYPECTOMY (N/A Rectum)  Patient Location: PACU  Anesthesia Type: General  Level of Consciousness: awake, alert  and patient cooperative  Airway and Oxygen Therapy: Patient Spontanous Breathing and Patient connected to supplemental oxygen  Post-op Assessment: Post-op Vital signs reviewed, Patient's Cardiovascular Status Stable, Respiratory Function Stable, Patent Airway and No signs of Nausea or vomiting  Post-op Vital Signs: Reviewed and stable  Complications: No complications documented.

## 2020-11-19 NOTE — Anesthesia Procedure Notes (Signed)
Date/Time: 11/19/2020 8:52 AM Performed by: Dionne Bucy, CRNA Pre-anesthesia Checklist: Patient identified, Emergency Drugs available, Suction available, Patient being monitored and Timeout performed Patient Re-evaluated:Patient Re-evaluated prior to induction Oxygen Delivery Method: Nasal cannula Placement Confirmation: positive ETCO2

## 2020-11-19 NOTE — H&P (Signed)
Lucilla Lame, MD [address] [place], [place] [address] Phone:[phone] Fax : [phone]  Primary Care Physician:  Lavera Guise, MD Primary Gastroenterologist:  Dr. Allen Norris  Pre-Procedure History & Physical: HPI:  Donna Beasley is a 70 y.o. female is here for an colonoscopy.   Past Medical History:  Diagnosis Date  . Arthritis    osteo - hands, toes, knees  . Atrophic vaginitis   . Eczema   . GERD (gastroesophageal reflux disease)    occas.  . Headache    sinus  . Hypertension   . Knee pain, right   . Motion sickness    boats (sometimes)  . Osteoarthritis   . Sinusitis     Past Surgical History:  Procedure Laterality Date  . COLONOSCOPY WITH PROPOFOL N/A 07/02/2015   Procedure: COLONOSCOPY WITH PROPOFOL;  Surgeon: Lucilla Lame, MD;  Location: [facility];  Service: Endoscopy;  Laterality: N/A;  . POLYPECTOMY  07/02/2015   Procedure: POLYPECTOMY;  Surgeon: Lucilla Lame, MD;  Location: [facility];  Service: Endoscopy;;  . UTERINE FIBROID SURGERY      Prior to Admission medications   Medication Sig Start Date End Date Taking? Authorizing Provider  carvedilol (COREG) 25 MG tablet Take 1 tablet (25 mg total) by mouth 2 (two) times daily. 02/17/20  Yes Boscia, Greer Ee, NP  hydrocortisone cream 0.5 % Apply 1 application topically 2 (two) times daily as needed for itching.   Yes [provider]  lisinopril-hydrochlorothiazide (ZESTORETIC) 20-12.5 MG tablet Take 1 tablet by mouth daily. 09/10/20  Yes Lavera Guise, MD  Multiple Vitamin (MULTIVITAMIN) tablet Take 1 tablet by mouth daily.   Yes [provider]  triamcinolone cream (KENALOG) 0.1 % Apply 1 application topically 2 (two) times daily as needed.   Yes [provider]  aspirin 81 MG tablet Take 81 mg by mouth daily. Patient not taking: Reported on 11/08/2020    [provider]    Allergies as of 10/31/2020  . (No Known Allergies)    Family  History  Problem Relation Age of Onset  . Hypertension Mother   . Thyroid disease Mother   . Hypertension Father   . Breast cancer Neg Hx     Social History   Socioeconomic History  . Marital status: Single    Spouse name: Not on file  . Number of children: Not on file  . Years of education: Not on file  . Highest education level: Not on file  Occupational History  . Not on file  Tobacco Use  . Smoking status: Current Some Day Smoker    Packs/day: 1.00    Types: Cigarettes  . Smokeless tobacco: Never Used  . Tobacco comment: may smoke 2 packs/wk. quit 06/25/15 for procedure  Substance and Sexual Activity  . Alcohol use: Not Currently  . Drug use: Never  . Sexual activity: Not on file  Other Topics Concern  . Not on file  Social History Narrative  . Not on file   Social Determinants of Health   Financial Resource Strain: Not on file  Food Insecurity: Not on file  Transportation Needs: Not on file  Physical Activity: Not on file  Stress: Not on file  Social Connections: Not on file  Intimate Partner Violence: Not on file    Review of Systems: See HPI, otherwise negative ROS  Physical Exam: BP (!) 146/90   Pulse 81   Temp (!) 97 F (36.1 C) (Temporal)  Resp 18   Ht 5\' 3"  (1.6 m)   Wt 87.5 kg   SpO2 97%   BMI 34.19 kg/m  General:   Alert,  pleasant and cooperative in NAD Head:  Normocephalic and atraumatic. Neck:  Supple; no masses or thyromegaly. Lungs:  Clear throughout to auscultation.    Heart:  Regular rate and rhythm. Abdomen:  Soft, nontender and nondistended. Normal bowel sounds, without guarding, and without rebound.   Neurologic:  Alert and  oriented x4;  grossly normal neurologically.  Impression/Plan: Donna Beasley is here for an colonoscopy to be performed for a history of adenomatous polyps on 07/02/2015   Risks, benefits, limitations, and alternatives regarding  colonoscopy have been reviewed with the patient.  Questions have been  answered.  All parties agreeable.   Lucilla Lame, MD  11/19/2020, 8:41 AM

## 2020-11-19 NOTE — Anesthesia Postprocedure Evaluation (Signed)
Anesthesia Post Note  Patient: Donna Beasley  Procedure(s) Performed: COLONOSCOPY WITH PROPOFOL (N/A Rectum) BIOPSY (N/A Rectum) POLYPECTOMY (N/A Rectum)     Patient location during evaluation: PACU Anesthesia Type: General Level of consciousness: awake Pain management: pain level controlled Vital Signs Assessment: post-procedure vital signs reviewed and stable Respiratory status: respiratory function stable Cardiovascular status: stable Postop Assessment: no signs of nausea or vomiting Anesthetic complications: no   No complications documented.  Veda Canning

## 2020-11-19 NOTE — Op Note (Signed)
[facility] Gastroenterology Patient Name: Donna Beasley Procedure Date: 11/19/2020 8:44 AM MRN: [id number] Account #: [id number] Date of Birth: 1951-05-12 Admit Type: Outpatient Age: 70 Room: [facility] OR ROOM 01 Gender: Female Note Status: Finalized Procedure:             Colonoscopy Indications:           High risk colon cancer surveillance: Personal history                         of colonic polyps Providers:             Lucilla Lame MD, MD Referring MD:          Lavera Guise, MD (Referring MD) Medicines:             Propofol per Anesthesia Complications:         No immediate complications. Procedure:             Pre-Anesthesia Assessment:                        - Prior to the procedure, a History and Physical was                         performed, and patient medications and allergies were                         reviewed. The patient's tolerance of previous                         anesthesia was also reviewed. The risks and benefits                         of the procedure and the sedation options and risks                         were discussed with the patient. All questions were                         answered, and informed consent was obtained. Prior                         Anticoagulants: The patient has taken no previous                         anticoagulant or antiplatelet agents. ASA Grade                         Assessment: II - A patient with mild systemic disease.                         After reviewing the risks and benefits, the patient                         was deemed in satisfactory condition to undergo the                         procedure.  After obtaining informed consent, the colonoscope was                         passed under direct vision. Throughout the procedure,                         the patient's blood pressure, pulse, and oxygen                         saturations were monitored continuously. The was                          introduced through the anus and advanced to the the                         terminal ileum. The colonoscopy was performed without                         difficulty. The patient tolerated the procedure well.                         The quality of the bowel preparation was excellent. Findings:      The perianal and digital rectal examinations were normal.      A 4 mm polyp was found in the sigmoid colon. The polyp was sessile. The       polyp was removed with a cold snare. Resection and retrieval were       complete.      Inflammation, moderate in severity and characterized by deep ulcerations       was found in the terminal ileum. Biopsies were taken with a cold forceps       for histology.      Multiple small-mouthed diverticula were found in the sigmoid colon.      Non-bleeding internal hemorrhoids were found during retroflexion. The       hemorrhoids were Grade I (internal hemorrhoids that do not prolapse). Impression:            - One 4 mm polyp in the sigmoid colon, removed with a                         cold snare. Resected and retrieved.                        - Ileitis. Biopsied.                        - Diverticulosis in the sigmoid colon.                        - Non-bleeding internal hemorrhoids. Recommendation:        - Discharge patient to home.                        - Resume previous diet.                        - Continue present medications.                        - Await pathology results.                        -  Repeat colonoscopy in 7 years for surveillance. Procedure Code(s):     --- Professional ---                        [phone], Colonoscopy, flexible; with removal of                         tumor(s), polyp(s), or other lesion(s) by snare                         technique                        45380, 59, Colonoscopy, flexible; with biopsy, single                         or multiple Diagnosis Code(s):     --- Professional ---                         Z86.010, Personal history of colonic polyps                        K63.5, Polyp of colon                        K52.9, Noninfective gastroenteritis and colitis,                         unspecified CPT copyright 2019 American Medical Association. All rights reserved. The codes documented in this report are preliminary and upon coder review may  be revised to meet current compliance requirements. Lucilla Lame MD, MD 11/19/2020 9:13:24 AM This report has been signed electronically. Number of Addenda: 0 Note Initiated On: 11/19/2020 8:44 AM Scope Withdrawal Time: 0 hours 10 minutes 42 seconds  Total Procedure Duration: 0 hours 14 minutes 10 seconds  Estimated Blood Loss:  Estimated blood loss: none.      [facility]

## 2020-11-19 NOTE — Anesthesia Preprocedure Evaluation (Signed)
Anesthesia Evaluation  Patient identified by MRN, date of birth, ID band Patient awake    Reviewed: Allergy & Precautions, NPO status   Airway Mallampati: II  TM Distance: >3 FB     Dental   Pulmonary Current Smoker and Patient abstained from smoking.,    breath sounds clear to auscultation       Cardiovascular hypertension,  Rhythm:Regular Rate:Normal     Neuro/Psych  Headaches,    GI/Hepatic GERD  ,  Endo/Other  diabetes (diet controlled)Obesity - BMI 35  Renal/GU      Musculoskeletal  (+) Arthritis ,   Abdominal   Peds  Hematology   Anesthesia Other Findings   Reproductive/Obstetrics                             Anesthesia Physical Anesthesia Plan  ASA: III  Anesthesia Plan: General   Post-op Pain Management:    Induction: Intravenous  PONV Risk Score and Plan: Propofol infusion, TIVA and Treatment may vary due to age or medical condition  Airway Management Planned: Natural Airway and Nasal Cannula  Additional Equipment:   Intra-op Plan:   Post-operative Plan:   Informed Consent: I have reviewed the patients History and Physical, chart, labs and discussed the procedure including the risks, benefits and alternatives for the proposed anesthesia with the patient or authorized representative who has indicated his/her understanding and acceptance.       Plan Discussed with: CRNA  Anesthesia Plan Comments:         Anesthesia Quick Evaluation

## 2020-11-20 ENCOUNTER — Other Ambulatory Visit: Payer: Self-pay | Admitting: Nurse Practitioner

## 2020-11-21 ENCOUNTER — Other Ambulatory Visit: Payer: Self-pay

## 2020-11-21 ENCOUNTER — Encounter: Payer: Self-pay | Admitting: Gastroenterology

## 2020-11-21 LAB — SURGICAL PATHOLOGY

## 2020-11-21 MED ORDER — CARVEDILOL 25 MG PO TABS: 25.0000 mg | ORAL_TABLET | Freq: Two times a day (BID) | ORAL | 2 refills | Status: DC

## 2020-11-26 ENCOUNTER — Encounter: Payer: Self-pay | Admitting: Gastroenterology

## 2021-01-15 ENCOUNTER — Other Ambulatory Visit: Payer: Self-pay | Admitting: Internal Medicine

## 2021-01-15 ENCOUNTER — Other Ambulatory Visit: Payer: Self-pay | Admitting: Hospice and Palliative Medicine

## 2021-01-15 DIAGNOSIS — Z1231 Encounter for screening mammogram for malignant neoplasm of breast: Secondary | ICD-10-CM

## 2021-01-24 ENCOUNTER — Other Ambulatory Visit: Payer: Self-pay

## 2021-01-24 ENCOUNTER — Ambulatory Visit
Admission: RE | Admit: 2021-01-24 | Discharge: 2021-01-24 | Disposition: A | Discharge status: Home / Self Care | Payer: Medicare PPO | Source: Ambulatory Visit | Attending: Hospice and Palliative Medicine | Admitting: Hospice and Palliative Medicine

## 2021-01-24 DIAGNOSIS — Z1231 Encounter for screening mammogram for malignant neoplasm of breast: Secondary | ICD-10-CM | POA: Insufficient documentation

## 2021-03-27 ENCOUNTER — Ambulatory Visit: Payer: Medicare PPO | Admitting: Internal Medicine

## 2021-03-28 ENCOUNTER — Ambulatory Visit: Payer: Medicare PPO | Admitting: Physician Assistant

## 2021-03-28 ENCOUNTER — Encounter: Payer: Self-pay | Admitting: Physician Assistant

## 2021-03-28 ENCOUNTER — Other Ambulatory Visit: Payer: Self-pay

## 2021-03-28 DIAGNOSIS — I10 Essential (primary) hypertension: Secondary | ICD-10-CM

## 2021-03-28 DIAGNOSIS — E669 Obesity, unspecified: Secondary | ICD-10-CM

## 2021-03-28 DIAGNOSIS — E1159 Type 2 diabetes mellitus with other circulatory complications: Secondary | ICD-10-CM

## 2021-03-28 DIAGNOSIS — E782 Mixed hyperlipidemia: Secondary | ICD-10-CM

## 2021-03-28 DIAGNOSIS — E119 Type 2 diabetes mellitus without complications: Secondary | ICD-10-CM | POA: Diagnosis not present

## 2021-03-28 DIAGNOSIS — Z23 Encounter for immunization: Secondary | ICD-10-CM | POA: Diagnosis not present

## 2021-03-28 DIAGNOSIS — R5383 Other fatigue: Secondary | ICD-10-CM

## 2021-03-28 LAB — POCT GLYCOSYLATED HEMOGLOBIN (HGB A1C): Hemoglobin A1C: 5.9 % — AB (ref 4.0–5.6)

## 2021-03-28 MED ORDER — TETANUS-DIPHTH-ACELL PERTUSSIS 5-2.5-18.5 LF-MCG/0.5 IM SUSP: 0.5000 mL | Freq: Once | INTRAMUSCULAR | 0 refills | Status: AC

## 2021-03-28 NOTE — Progress Notes (Signed)
[facility] [place], [place] [address]  Internal MEDICINE  Office Visit Note  Patient Name: Donna Beasley  [id number]  [id number]  Date of Service: 04/02/2021  Chief Complaint  Patient presents with   Follow-up    Wants to get at home covid tests    Gastroesophageal Reflux   Hypertension   Quality Metric Gaps    Shingrix was done within the last 10 years, pt will call with the dates, foot exam    HPI Pt is here for routine follow up and has no complaints today -She completed her mammogram and colonoscopy since last visit -She did not have her labs done and requests a new order today so that she can get these done -BP at home not checked recently due to machine breaking, does not check BG at home -Due for tetanus booster and will have this done -Given website information to order the home covid tests  Current Medication: Outpatient Encounter Medications as of 03/28/2021  Medication Sig   carvedilol (COREG) 25 MG tablet Take 1 tablet (25 mg total) by mouth 2 (two) times daily.   hydrocortisone cream 0.5 % Apply 1 application topically 2 (two) times daily as needed for itching.   lisinopril-hydrochlorothiazide (ZESTORETIC) 20-12.5 MG tablet Take 1 tablet by mouth daily.   Multiple Vitamin (MULTIVITAMIN) tablet Take 1 tablet by mouth daily.   triamcinolone cream (KENALOG) 0.1 % Apply 1 application topically 2 (two) times daily as needed.   [DISCONTINUED] Tdap (BOOSTRIX) 5-2.5-18.5 LF-MCG/0.5 injection Inject 0.5 mLs into the muscle once.   [EXPIRED] Tdap (BOOSTRIX) 5-2.5-18.5 LF-MCG/0.5 injection Inject 0.5 mLs into the muscle once for 1 dose.   [DISCONTINUED] aspirin 81 MG tablet Take 81 mg by mouth daily. (Patient not taking: No sig reported)   No facility-administered encounter medications on file as of 03/28/2021.    Surgical History: Past Surgical History:  Procedure Laterality Date   BIOPSY N/A 11/19/2020   Procedure: BIOPSY;  Surgeon: Lucilla Lame, MD;  Location: [place];  Service: Endoscopy;  Laterality: N/A;   COLONOSCOPY WITH PROPOFOL N/A 07/02/2015   Procedure: COLONOSCOPY WITH PROPOFOL;  Surgeon: Lucilla Lame, MD;  Location: [place];  Service: Endoscopy;  Laterality: N/A;   COLONOSCOPY WITH PROPOFOL N/A 11/19/2020   Procedure: COLONOSCOPY WITH PROPOFOL;  Surgeon: Lucilla Lame, MD;  Location: [place];  Service: Endoscopy;  Laterality: N/A;   POLYPECTOMY  07/02/2015   Procedure: POLYPECTOMY;  Surgeon: Lucilla Lame, MD;  Location: [place];  Service: Endoscopy;;   POLYPECTOMY N/A 11/19/2020   Procedure: POLYPECTOMY;  Surgeon: Lucilla Lame, MD;  Location: [place];  Service: Endoscopy;  Laterality: N/A;   UTERINE FIBROID SURGERY      Medical History: Past Medical History:  Diagnosis Date   Arthritis    osteo - hands, toes, knees   Atrophic vaginitis    Eczema    GERD (gastroesophageal reflux disease)    occas.   Headache    sinus   Hypertension    Knee pain, right    Motion sickness    boats (sometimes)   Osteoarthritis    Sinusitis     Family History: Family History  Problem Relation Age of Onset   Hypertension Mother    Thyroid disease Mother    Hypertension Father    Breast cancer Neg Hx     Social History   Socioeconomic History   Marital status: Single    Spouse name: Not on file  Number of children: Not on file   Years of education: Not on file   Highest education level: Not on file  Occupational History   Not on file  Tobacco Use   Smoking status: Some Days    Packs/day: 1.00    Types: Cigarettes   Smokeless tobacco: Never   Tobacco comments:    may smoke 2 packs/wk. quit 06/25/15 for procedure  Substance and Sexual Activity   Alcohol use: Not Currently   Drug use: Never   Sexual activity: Not on file  Other Topics Concern   Not on file  Social History Narrative   Not on file   Social Determinants of Health   Financial  Resource Strain: Not on file  Food Insecurity: Not on file  Transportation Needs: Not on file  Physical Activity: Not on file  Stress: Not on file  Social Connections: Not on file  Intimate Partner Violence: Not on file      Review of Systems  Constitutional:  Negative for chills, fatigue and unexpected weight change.  HENT:  Negative for congestion, postnasal drip, rhinorrhea, sneezing and sore throat.   Eyes:  Negative for redness.  Respiratory:  Negative for cough, chest tightness and shortness of breath.   Cardiovascular:  Negative for chest pain and palpitations.  Gastrointestinal:  Negative for abdominal pain, constipation, diarrhea, nausea and vomiting.  Genitourinary:  Negative for dysuria and frequency.  Musculoskeletal:  Negative for arthralgias, back pain, joint swelling and neck pain.  Skin:  Negative for rash.  Neurological: Negative.  Negative for tremors and numbness.  Hematological:  Negative for adenopathy. Does not bruise/bleed easily.  Psychiatric/Behavioral:  Negative for behavioral problems (Depression), sleep disturbance and suicidal ideas. The patient is not nervous/anxious.    Vital Signs: BP 134/84   Pulse 70   Temp (!) 97.3 F (36.3 C)   Resp 16   Ht 5\' 3"  (1.6 m)   Wt 192 lb (87.1 kg)   SpO2 97%   BMI 34.01 kg/m    Physical Exam Vitals and nursing note reviewed.  Constitutional:      General: She is not in acute distress.    Appearance: She is well-developed. She is obese. She is not diaphoretic.  HENT:     Head: Normocephalic and atraumatic.     Mouth/Throat:     Pharynx: No oropharyngeal exudate.  Eyes:     Pupils: Pupils are equal, round, and reactive to light.  Neck:     Thyroid: No thyromegaly.     Vascular: No JVD.     Trachea: No tracheal deviation.  Cardiovascular:     Rate and Rhythm: Normal rate and regular rhythm.     Pulses:          Dorsalis pedis pulses are 3+ on the right side and 3+ on the left side.       Posterior  tibial pulses are 3+ on the right side and 3+ on the left side.     Heart sounds: Normal heart sounds. No murmur heard.   No friction rub. No gallop.  Pulmonary:     Effort: Pulmonary effort is normal. No respiratory distress.     Breath sounds: No wheezing or rales.  Chest:     Chest wall: No tenderness.  Abdominal:     General: Bowel sounds are normal.     Palpations: Abdomen is soft.  Musculoskeletal:        General: Normal range of motion.     Cervical  back: Normal range of motion and neck supple.     Right foot: Normal range of motion.     Left foot: Normal range of motion.  Feet:     Right foot:     Protective Sensation: 2 sites tested.  2 sites sensed.     Skin integrity: Skin integrity normal.     Toenail Condition: Right toenails are normal.     Left foot:     Protective Sensation: 2 sites tested.  2 sites sensed.     Skin integrity: Skin integrity normal.     Toenail Condition: Left toenails are normal.  Lymphadenopathy:     Cervical: No cervical adenopathy.  Skin:    General: Skin is warm and dry.  Neurological:     Mental Status: She is alert and oriented to person, place, and time.     Cranial Nerves: No cranial nerve deficit.  Psychiatric:        Behavior: Behavior normal.        Thought Content: Thought content normal.        Judgment: Judgment normal.       Assessment/Plan: 1. Type 2 diabetes mellitus with other circulatory complications (HCC) - POCT HgB A1C is 5.9 and well controlled with diet and exercise. Continue lifestyle changes.  2. Essential hypertension Well controlled, continue current medication  3. Need for diphtheria-tetanus-pertussis (Tdap) vaccine - Tdap (BOOSTRIX) 5-2.5-18.5 LF-MCG/0.5 injection; Inject 0.5 mLs into the muscle once for 1 dose.  Dispense: 0.5 mL; Refill: 0  4. Mixed hyperlipidemia Will update labs - Lipid Panel With LDL/HDL Ratio  5. Other fatigue - TSH + free T4 - Lipid Panel With LDL/HDL Ratio - Comprehensive  metabolic panel - CBC w/Diff/Platelet  6. Obesity (BMI 30.0-34.9) Obesity Counseling: Had a lengthy discussion regarding patients BMI and weight issues. Patient was instructed on portion control as well as increased activity. Also discussed caloric restrictions with trying to maintain intake less than 2000 Kcal. Discussions were made in accordance with the 5As of weight management. Simple actions such as not eating late and if able to, taking a walk is suggested.    General Counseling: drucella karbowski understanding of the findings of todays visit and agrees with plan of treatment. I have discussed any further diagnostic evaluation that may be needed or ordered today. We also reviewed her medications today. she has been encouraged to call the office with any questions or concerns that should arise related to todays visit.    Orders Placed This Encounter  Procedures   TSH + free T4   Lipid Panel With LDL/HDL Ratio   Comprehensive metabolic panel   CBC w/Diff/Platelet   POCT HgB A1C    Meds ordered this encounter  Medications   Tdap (BOOSTRIX) 5-2.5-18.5 LF-MCG/0.5 injection    Sig: Inject 0.5 mLs into the muscle once for 1 dose.    Dispense:  0.5 mL    Refill:  0    This patient was seen by Drema Dallas, PA-C in collaboration with Dr. Clayborn Bigness as a part of collaborative care agreement.   Total time spent:35 Minutes Time spent includes review of chart, medications, test results, and follow up plan with the patient.      Dr Lavera Guise Internal medicine

## 2021-05-19 ENCOUNTER — Other Ambulatory Visit: Payer: Self-pay | Admitting: Internal Medicine

## 2021-05-22 DIAGNOSIS — R5383 Other fatigue: Secondary | ICD-10-CM | POA: Diagnosis not present

## 2021-05-23 LAB — CBC WITH DIFFERENTIAL/PLATELET
Basophils Absolute: 0.1 10*3/uL (ref 0.0–0.2)
Basos: 1 %
EOS (ABSOLUTE): 0.5 10*3/uL — ABNORMAL HIGH (ref 0.0–0.4)
Eos: 4 %
Hematocrit: 42.2 % (ref 34.0–46.6)
Hemoglobin: 14.2 g/dL (ref 11.1–15.9)
Immature Grans (Abs): 0 10*3/uL (ref 0.0–0.1)
Immature Granulocytes: 0 %
Lymphocytes Absolute: 3.3 10*3/uL — ABNORMAL HIGH (ref 0.7–3.1)
Lymphs: 28 %
MCH: 30.6 pg (ref 26.6–33.0)
MCHC: 33.6 g/dL (ref 31.5–35.7)
MCV: 91 fL (ref 79–97)
Monocytes Absolute: 1.1 10*3/uL — ABNORMAL HIGH (ref 0.1–0.9)
Monocytes: 10 %
Neutrophils Absolute: 6.5 10*3/uL (ref 1.4–7.0)
Neutrophils: 57 %
Platelets: 362 10*3/uL (ref 150–450)
RBC: 4.64 x10E6/uL (ref 3.77–5.28)
RDW: 13.1 % (ref 11.7–15.4)
WBC: 11.5 10*3/uL — ABNORMAL HIGH (ref 3.4–10.8)

## 2021-05-23 LAB — COMPREHENSIVE METABOLIC PANEL
ALT: 11 IU/L (ref 0–32)
AST: 15 IU/L (ref 0–40)
Albumin/Globulin Ratio: 1.5 (ref 1.2–2.2)
Albumin: 4.1 g/dL (ref 3.7–4.7)
Alkaline Phosphatase: 72 IU/L (ref 44–121)
BUN/Creatinine Ratio: 26 (ref 12–28)
BUN: 22 mg/dL (ref 8–27)
Bilirubin Total: 0.2 mg/dL (ref 0.0–1.2)
CO2: 23 mmol/L (ref 20–29)
Calcium: 9.3 mg/dL (ref 8.7–10.3)
Chloride: 100 mmol/L (ref 96–106)
Creatinine, Ser: 0.85 mg/dL (ref 0.57–1.00)
Globulin, Total: 2.7 g/dL (ref 1.5–4.5)
Glucose: 123 mg/dL — ABNORMAL HIGH (ref 65–99)
Potassium: 5 mmol/L (ref 3.5–5.2)
Sodium: 136 mmol/L (ref 134–144)
Total Protein: 6.8 g/dL (ref 6.0–8.5)
eGFR: 73 mL/min/{1.73_m2} (ref >59)

## 2021-05-23 LAB — LIPID PANEL WITH LDL/HDL RATIO
Cholesterol, Total: 190 mg/dL (ref 100–199)
HDL: 35 mg/dL — ABNORMAL LOW (ref >39)
LDL Chol Calc (NIH): 135 mg/dL — ABNORMAL HIGH (ref 0–99)
LDL/HDL Ratio: 3.9 ratio — ABNORMAL HIGH (ref 0.0–3.2)
Triglycerides: 110 mg/dL (ref 0–149)
VLDL Cholesterol Cal: 20 mg/dL (ref 5–40)

## 2021-05-23 LAB — TSH+FREE T4
Free T4: 1.19 ng/dL (ref 0.82–1.77)
TSH: 3.2 u[IU]/mL (ref 0.450–4.500)

## 2021-08-17 ENCOUNTER — Other Ambulatory Visit: Payer: Self-pay | Admitting: Internal Medicine

## 2021-09-24 ENCOUNTER — Telehealth: Payer: Self-pay

## 2021-09-24 NOTE — Telephone Encounter (Signed)
Confirmed appointment on 09-27-21.

## 2021-09-27 ENCOUNTER — Other Ambulatory Visit: Payer: Self-pay

## 2021-09-27 ENCOUNTER — Ambulatory Visit (INDEPENDENT_AMBULATORY_CARE_PROVIDER_SITE_OTHER): Payer: Medicare PPO | Admitting: Physician Assistant

## 2021-09-27 ENCOUNTER — Encounter: Payer: Self-pay | Admitting: Physician Assistant

## 2021-09-27 VITALS — BP 138/72 | HR 61 | Temp 97.8°F | Resp 16 | Ht 63.0 in | Wt 185.0 lb

## 2021-09-27 DIAGNOSIS — D72829 Elevated white blood cell count, unspecified: Secondary | ICD-10-CM | POA: Diagnosis not present

## 2021-09-27 DIAGNOSIS — E782 Mixed hyperlipidemia: Secondary | ICD-10-CM

## 2021-09-27 DIAGNOSIS — E1159 Type 2 diabetes mellitus with other circulatory complications: Secondary | ICD-10-CM | POA: Diagnosis not present

## 2021-09-27 DIAGNOSIS — Z0001 Encounter for general adult medical examination with abnormal findings: Secondary | ICD-10-CM

## 2021-09-27 DIAGNOSIS — M2550 Pain in unspecified joint: Secondary | ICD-10-CM | POA: Diagnosis not present

## 2021-09-27 DIAGNOSIS — E119 Type 2 diabetes mellitus without complications: Secondary | ICD-10-CM

## 2021-09-27 DIAGNOSIS — R5383 Other fatigue: Secondary | ICD-10-CM

## 2021-09-27 DIAGNOSIS — I10 Essential (primary) hypertension: Secondary | ICD-10-CM | POA: Diagnosis not present

## 2021-09-27 DIAGNOSIS — R300 Dysuria: Secondary | ICD-10-CM | POA: Diagnosis not present

## 2021-09-27 LAB — POCT GLYCOSYLATED HEMOGLOBIN (HGB A1C): Hemoglobin A1C: 6.1 % — AB (ref 4.0–5.6)

## 2021-09-27 NOTE — Progress Notes (Signed)
[facility] [place], [place] [address]  Internal MEDICINE  Office Visit Note  Patient Name: Donna Beasley  [id number]  [id number]  Date of Service: 10/04/2021  Chief Complaint  Patient presents with   Medicare Wellness   Hypertension   Gastroesophageal Reflux     HPI Pt is here for routine health maintenance examination -Reviewed labs--showing chronically elevated WBC though lower than prior. Discussed potential hematology evaluation however patient would prefer to hold off on this since this has been chronic without any symptoms. Discussed rechecking and referring if not further decreased. Cholesterol is also elevated, but does not want to start any medication as she has been making life changes, may try fish oil as well.  Will order carotid US as well -Has not been able to go to pool lately but will reopen soon to start exercises again -Has joint pain, notes previous rheum eval that was normal -Up to date on PHM  Current Medication: Outpatient Encounter Medications as of 09/27/2021  Medication Sig   carvedilol (COREG) 25 MG tablet TAKE 1 TABLET BY MOUTH TWICE A DAY   hydrocortisone cream 0.5 % Apply 1 application topically 2 (two) times daily as needed for itching.   lisinopril-hydrochlorothiazide (ZESTORETIC) 20-12.5 MG tablet TAKE 1 TABLET BY MOUTH EVERY DAY   Multiple Vitamin (MULTIVITAMIN) tablet Take 1 tablet by mouth daily.   triamcinolone cream (KENALOG) 0.1 % Apply 1 application topically 2 (two) times daily as needed.   No facility-administered encounter medications on file as of 09/27/2021.    Surgical History: Past Surgical History:  Procedure Laterality Date   BIOPSY N/A 11/19/2020   Procedure: BIOPSY;  Surgeon: Lucilla Lame, MD;  Location: [place];  Service: Endoscopy;  Laterality: N/A;   COLONOSCOPY WITH PROPOFOL N/A 07/02/2015   Procedure: COLONOSCOPY WITH PROPOFOL;  Surgeon: Lucilla Lame, MD;  Location: [place];  Service: Endoscopy;  Laterality: N/A;   COLONOSCOPY WITH PROPOFOL N/A 11/19/2020   Procedure: COLONOSCOPY WITH PROPOFOL;  Surgeon: Lucilla Lame, MD;  Location: [place];  Service: Endoscopy;  Laterality: N/A;   POLYPECTOMY  07/02/2015   Procedure: POLYPECTOMY;  Surgeon: Lucilla Lame, MD;  Location: [place];  Service: Endoscopy;;   POLYPECTOMY N/A 11/19/2020   Procedure: POLYPECTOMY;  Surgeon: Lucilla Lame, MD;  Location: [place];  Service: Endoscopy;  Laterality: N/A;   UTERINE FIBROID SURGERY      Medical History: Past Medical History:  Diagnosis Date   Arthritis    osteo - hands, toes, knees   Atrophic vaginitis    Eczema    GERD (gastroesophageal reflux disease)    occas.   Headache    sinus   Hypertension    Knee pain, right    Motion sickness    boats (sometimes)   Osteoarthritis    Sinusitis     Family History: Family History  Problem Relation Age of Onset   Hypertension Mother    Thyroid disease Mother    Hypertension Father    Breast cancer Neg Hx       Review of Systems  Constitutional:  Negative for chills, fatigue and unexpected weight change.  HENT:  Negative for congestion, postnasal drip, rhinorrhea, sneezing and sore throat.   Eyes:  Negative for redness.  Respiratory:  Negative for cough, chest tightness and shortness of breath.   Cardiovascular:  Negative for chest pain and palpitations.  Gastrointestinal:  Negative for abdominal pain, constipation, diarrhea, nausea and vomiting.  Genitourinary:  Negative for dysuria and frequency.  Musculoskeletal:  Positive for arthralgias. Negative for back pain, joint swelling and neck pain.  Skin:  Negative for rash.  Neurological: Negative.  Negative for tremors and numbness.  Hematological:  Negative for adenopathy. Does not bruise/bleed easily.  Psychiatric/Behavioral:  Negative for behavioral problems (Depression), sleep disturbance and suicidal ideas. The patient is  not nervous/anxious.     Vital Signs: BP 138/72 Comment: 143/72   Pulse 61    Temp 97.8 F (36.6 C)    Resp 16    Ht 5\' 3"  (1.6 m)    Wt 185 lb (83.9 kg)    SpO2 97%    BMI 32.77 kg/m    Physical Exam Vitals and nursing note reviewed.  Constitutional:      General: She is not in acute distress.    Appearance: She is well-developed. She is not diaphoretic.  HENT:     Head: Normocephalic and atraumatic.     Mouth/Throat:     Pharynx: No oropharyngeal exudate.  Eyes:     Pupils: Pupils are equal, round, and reactive to light.  Neck:     Thyroid: No thyromegaly.     Vascular: No JVD.     Trachea: No tracheal deviation.  Cardiovascular:     Rate and Rhythm: Normal rate and regular rhythm.     Heart sounds: Normal heart sounds. No murmur heard.   No friction rub. No gallop.  Pulmonary:     Effort: Pulmonary effort is normal. No respiratory distress.     Breath sounds: No wheezing or rales.  Chest:     Chest wall: No tenderness.  Breasts:    Right: Normal. No mass.     Left: Normal. No mass.  Abdominal:     General: Bowel sounds are normal.     Palpations: Abdomen is soft.  Musculoskeletal:        General: Normal range of motion.     Cervical back: Normal range of motion and neck supple.  Lymphadenopathy:     Cervical: No cervical adenopathy.  Skin:    General: Skin is warm and dry.  Neurological:     Mental Status: She is alert and oriented to person, place, and time.     Cranial Nerves: No cranial nerve deficit.  Psychiatric:        Behavior: Behavior normal.        Thought Content: Thought content normal.        Judgment: Judgment normal.     LABS: Recent Results (from the past 2160 hour(s))  POCT HgB A1C     Status: Abnormal   Collection Time: 09/27/21  9:22 AM  Result Value Ref Range   Hemoglobin A1C 6.1 (A) 4.0 - 5.6 %   HbA1c POC (<> result, manual entry)     HbA1c, POC (prediabetic range)     HbA1c, POC (controlled diabetic range)    UA/M w/rflx  Culture, Routine     Status: Abnormal   Collection Time: 09/27/21 12:33 PM   Specimen: Urine   Urine  Result Value Ref Range   Specific Gravity, UA 1.017 1.005 - 1.030   pH, UA 5.5 5.0 - 7.5   Color, UA Yellow Yellow   Appearance Ur Clear Clear   Leukocytes,UA Trace (A) Negative   Protein,UA Negative Negative/Trace   Glucose, UA Negative Negative   Ketones, UA Negative Negative   RBC, UA Negative Negative   Bilirubin, UA Negative Negative   Urobilinogen, Ur 0.2  0.2 - 1.0 mg/dL   Nitrite, UA Negative Negative   Microscopic Examination See below:     Comment: Microscopic was indicated and was performed.   Urinalysis Reflex Comment     Comment: This specimen has reflexed to a Urine Culture.  Microscopic Examination     Status: None   Collection Time: 09/27/21 12:33 PM   Urine  Result Value Ref Range   WBC, UA None seen 0 - 5 /hpf   RBC 0-2 0 - 2 /hpf   Epithelial Cells (non renal) 0-10 0 - 10 /hpf   Casts None seen None seen /lpf   Bacteria, UA Few None seen/Few  Urine Culture, Reflex     Status: Abnormal   Collection Time: 09/27/21 12:33 PM   Urine  Result Value Ref Range   Urine Culture, Routine Final report (A)    Organism ID, Bacteria Klebsiella pneumoniae (A)     Comment: Cefazolin <=4 ug/mL Cefazolin with an MIC <=16 predicts susceptibility to the oral agents cefaclor, cefdinir, cefpodoxime, cefprozil, cefuroxime, cephalexin, and loracarbef when used for therapy of uncomplicated urinary tract infections due to E. coli, Klebsiella pneumoniae, and Proteus mirabilis. Greater than 100,000 colony forming units per mL    Antimicrobial Susceptibility Comment     Comment:       ** S = Susceptible; I = Intermediate; R = Resistant **                    P = Positive; N = Negative             MICS are expressed in micrograms per mL    Antibiotic                 RSLT#1    RSLT#2    RSLT#3    RSLT#4 Amoxicillin/Clavulanic Acid    S Ampicillin                     R Cefepime                        S Ceftriaxone                    S Cefuroxime                     S Ciprofloxacin                  S Ertapenem                      S Gentamicin                     S Imipenem                       S Levofloxacin                   S Meropenem                      S Nitrofurantoin                 I Piperacillin/Tazobactam        S Tetracycline                   S Tobramycin  S Trimethoprim/Sulfa             S         Assessment/Plan: 1. Encounter for general adult medical examination with abnormal findings CPE performed, routine fasting labs reviewed, up-to-date on PHM  2. Essential hypertension Stable, continue current medication  3. Type 2 diabetes mellitus with other circulatory complications (HCC) - POCT HgB A1C is 6.1 which is slightly increased from previous.  Continue to work on diet and exercise - Comprehensive metabolic panel  4. Mixed hyperlipidemia Work on diet and exercise and try fish oil.  We will check a carotid ultrasound and recheck labs prior to next visit.  May need to add statin in future - US Carotid Duplex Bilateral; Future - Lipid Panel With LDL/HDL Ratio  5. Leukocytosis, unspecified type Discussed referring to hematology for further evaluation of chronic leukocytosis however patient would like to hold off at this time and recheck labs before next visit.  If still elevated will need referral at that time - CBC w/Diff/Platelet  6. Arthralgia, unspecified joint - Sedimentation rate - ANA w/Reflex if Positive - Rheumatoid Factor  7. Other fatigue - CBC w/Diff/Platelet - TSH + free T4  8. Dysuria - UA/M w/rflx Culture, Routine - Microscopic Examination - Urine Culture, Reflex   General Counseling: raelan burgoon understanding of the findings of todays visit and agrees with plan of treatment. I have discussed any further diagnostic evaluation that may be needed or ordered today. We also reviewed her  medications today. she has been encouraged to call the office with any questions or concerns that should arise related to todays visit.    Counseling:    Orders Placed This Encounter  Procedures   Microscopic Examination   Urine Culture, Reflex   US Carotid Duplex Bilateral   UA/M w/rflx Culture, Routine   CBC w/Diff/Platelet   Comprehensive metabolic panel   TSH + free T4   Lipid Panel With LDL/HDL Ratio   Sedimentation rate   ANA w/Reflex if Positive   Rheumatoid Factor   POCT HgB A1C    No orders of the defined types were placed in this encounter.   This patient was seen by Drema Dallas, PA-C in collaboration with Dr. Clayborn Bigness as a part of collaborative care agreement.  Total time spent:35 Minutes  Time spent includes review of chart, medications, test results, and follow up plan with the patient.     Lavera Guise, MD  Internal Medicine

## 2021-10-02 ENCOUNTER — Ambulatory Visit (INDEPENDENT_AMBULATORY_CARE_PROVIDER_SITE_OTHER): Payer: Medicare PPO

## 2021-10-02 ENCOUNTER — Other Ambulatory Visit: Payer: Self-pay

## 2021-10-02 DIAGNOSIS — I6523 Occlusion and stenosis of bilateral carotid arteries: Secondary | ICD-10-CM | POA: Diagnosis not present

## 2021-10-02 DIAGNOSIS — E782 Mixed hyperlipidemia: Secondary | ICD-10-CM

## 2021-10-03 ENCOUNTER — Other Ambulatory Visit: Payer: Self-pay | Admitting: Physician Assistant

## 2021-10-03 DIAGNOSIS — N3000 Acute cystitis without hematuria: Secondary | ICD-10-CM

## 2021-10-03 LAB — UA/M W/RFLX CULTURE, ROUTINE
Bilirubin, UA: NEGATIVE
Glucose, UA: NEGATIVE
Ketones, UA: NEGATIVE
Nitrite, UA: NEGATIVE
Protein,UA: NEGATIVE
RBC, UA: NEGATIVE
Specific Gravity, UA: 1.017 (ref 1.005–1.030)
Urobilinogen, Ur: 0.2 mg/dL (ref 0.2–1.0)
pH, UA: 5.5 (ref 5.0–7.5)

## 2021-10-03 LAB — MICROSCOPIC EXAMINATION
Casts: NONE SEEN /lpf
WBC, UA: NONE SEEN /hpf (ref 0–5)

## 2021-10-03 LAB — URINE CULTURE, REFLEX

## 2021-10-03 MED ORDER — CIPROFLOXACIN HCL 500 MG PO TABS: 500.0000 mg | ORAL_TABLET | Freq: Two times a day (BID) | ORAL | 0 refills | Status: AC

## 2021-10-04 ENCOUNTER — Telehealth: Payer: Self-pay

## 2021-10-11 NOTE — Telephone Encounter (Signed)
Lmom to call back. 

## 2021-11-15 ENCOUNTER — Other Ambulatory Visit: Payer: Self-pay | Admitting: Internal Medicine

## 2021-11-17 NOTE — Procedures (Signed)
[place], [place] [address]  DATE OF SERVICE: October 02, 2021  CAROTID DOPPLER INTERPRETATION:  Bilateral Carotid Ultrsasound and Color Doppler Examination was performed. The RIGHT CCA shows no significant plaque in the vessel. The LEFT CCA shows no significant plaque in the vessel. There was no significant intimal thickening noted in the RIGHT carotid artery. There was no significant intimal thickening in the LEFT carotid artery.  The RIGHT CCA shows peak systolic velocity of 66 cm per second. The end diastolic velocity is 14 cm per second on the RIGHT side. The RIGHT ICA shows peak systolic velocity of 76 per second. RIGHT sided ICA end diastolic velocity is 23 cm per second. The RIGHT ECA shows a peak systolic velocity of 700 cm per second. The ICA/CCA ratio is calculated to be 1.2. This suggests less than 50% stenosis. The Vertebral Artery shows antegrade flow.  The LEFT CCA shows peak systolic velocity of 58 cm per second. The end diastolic velocity is 15 cm per second on the LEFT side. The LEFT ICA shows peak systolic velocity of 94 per second. LEFT sided ICA end diastolic velocity is 23 cm per second. The LEFT ECA shows a peak systolic velocity of 99 cm per second. The ICA/CCA ratio is calculated to be 1.6. This suggests less than 50% stenosis. The Vertebral Artery shows antegrade flow.   Impression:    The RIGHT CAROTID shows less than 50% stenosis. The LEFT CAROTID shows less than 50% stenosis.  There is no significant plaque formation noted on the LEFT and no significant plaque on the RIGHT  side. Consider a repeat Carotid doppler if clinical situation and symptoms warrant in 6-12 months. Patient should be encouraged to change lifestyles such as smoking cessation, regular exercise and dietary modification. Use of statins in the right clinical setting and ASA is encouraged.  Allyne Gee, MD [facility] Pulmonary Critical Care Medicine

## 2021-11-20 ENCOUNTER — Telehealth: Payer: Self-pay

## 2021-11-20 NOTE — Telephone Encounter (Signed)
-----   Message from Mylinda Latina, PA-C sent at 11/19/2021  4:35 PM EDT ----- ?Please let her know that her carotid US did not show significant plaque or stenosis. Continue to work on diet changes and taking fish oil as discussed ?

## 2021-11-20 NOTE — Telephone Encounter (Signed)
Spoke with patient regarding ultrasound results on 11/20/2021. ?

## 2021-11-22 ENCOUNTER — Encounter: Payer: Self-pay | Admitting: Physician Assistant

## 2021-12-11 DIAGNOSIS — H40053 Ocular hypertension, bilateral: Secondary | ICD-10-CM | POA: Diagnosis not present

## 2022-01-20 ENCOUNTER — Other Ambulatory Visit: Payer: Self-pay | Admitting: Internal Medicine

## 2022-01-20 DIAGNOSIS — Z1231 Encounter for screening mammogram for malignant neoplasm of breast: Secondary | ICD-10-CM

## 2022-02-19 ENCOUNTER — Ambulatory Visit
Admission: RE | Admit: 2022-02-19 | Discharge: 2022-02-19 | Disposition: A | Discharge status: Home / Self Care | Payer: Medicare PPO | Source: Ambulatory Visit | Attending: Internal Medicine | Admitting: Internal Medicine

## 2022-02-19 DIAGNOSIS — Z1231 Encounter for screening mammogram for malignant neoplasm of breast: Secondary | ICD-10-CM | POA: Insufficient documentation

## 2022-02-20 DIAGNOSIS — M2550 Pain in unspecified joint: Secondary | ICD-10-CM | POA: Diagnosis not present

## 2022-02-20 DIAGNOSIS — E119 Type 2 diabetes mellitus without complications: Secondary | ICD-10-CM | POA: Diagnosis not present

## 2022-02-20 DIAGNOSIS — E782 Mixed hyperlipidemia: Secondary | ICD-10-CM | POA: Diagnosis not present

## 2022-02-20 DIAGNOSIS — R5383 Other fatigue: Secondary | ICD-10-CM | POA: Diagnosis not present

## 2022-02-20 DIAGNOSIS — D72829 Elevated white blood cell count, unspecified: Secondary | ICD-10-CM | POA: Diagnosis not present

## 2022-02-21 LAB — ANA W/REFLEX IF POSITIVE
Anti JO-1: <0.2 AI (ref 0.0–0.9)
Anti Nuclear Antibody (ANA): POSITIVE — AB
Centromere Ab Screen: <0.2 AI (ref 0.0–0.9)
Chromatin Ab SerPl-aCnc: <0.2 AI (ref 0.0–0.9)
ENA RNP Ab: 1.1 AI — ABNORMAL HIGH (ref 0.0–0.9)
ENA SM Ab Ser-aCnc: <0.2 AI (ref 0.0–0.9)
ENA SSA (RO) Ab: <0.2 AI (ref 0.0–0.9)
ENA SSB (LA) Ab: <0.2 AI (ref 0.0–0.9)
Scleroderma (Scl-70) (ENA) Antibody, IgG: <0.2 AI (ref 0.0–0.9)
dsDNA Ab: 2 IU/mL (ref 0–9)

## 2022-02-21 LAB — CBC WITH DIFFERENTIAL/PLATELET
Basophils Absolute: 0.1 10*3/uL (ref 0.0–0.2)
Basos: 1 %
EOS (ABSOLUTE): 0.5 10*3/uL — ABNORMAL HIGH (ref 0.0–0.4)
Eos: 4 %
Hematocrit: 41.1 % (ref 34.0–46.6)
Hemoglobin: 13.3 g/dL (ref 11.1–15.9)
Immature Grans (Abs): 0 10*3/uL (ref 0.0–0.1)
Immature Granulocytes: 0 %
Lymphocytes Absolute: 3.3 10*3/uL — ABNORMAL HIGH (ref 0.7–3.1)
Lymphs: 29 %
MCH: 30 pg (ref 26.6–33.0)
MCHC: 32.4 g/dL (ref 31.5–35.7)
MCV: 93 fL (ref 79–97)
Monocytes Absolute: 1 10*3/uL — ABNORMAL HIGH (ref 0.1–0.9)
Monocytes: 9 %
Neutrophils Absolute: 6.4 10*3/uL (ref 1.4–7.0)
Neutrophils: 57 %
Platelets: 360 10*3/uL (ref 150–450)
RBC: 4.44 x10E6/uL (ref 3.77–5.28)
RDW: 13.2 % (ref 11.7–15.4)
WBC: 11.3 10*3/uL — ABNORMAL HIGH (ref 3.4–10.8)

## 2022-02-21 LAB — COMPREHENSIVE METABOLIC PANEL
ALT: 8 IU/L (ref 0–32)
AST: 12 IU/L (ref 0–40)
Albumin/Globulin Ratio: 1.3 (ref 1.2–2.2)
Albumin: 3.8 g/dL (ref 3.7–4.7)
Alkaline Phosphatase: 69 IU/L (ref 44–121)
BUN/Creatinine Ratio: 20 (ref 12–28)
BUN: 19 mg/dL (ref 8–27)
Bilirubin Total: 0.2 mg/dL (ref 0.0–1.2)
CO2: 21 mmol/L (ref 20–29)
Calcium: 8.9 mg/dL (ref 8.7–10.3)
Chloride: 99 mmol/L (ref 96–106)
Creatinine, Ser: 0.95 mg/dL (ref 0.57–1.00)
Globulin, Total: 3 g/dL (ref 1.5–4.5)
Glucose: 112 mg/dL — ABNORMAL HIGH (ref 70–99)
Potassium: 5 mmol/L (ref 3.5–5.2)
Sodium: 135 mmol/L (ref 134–144)
Total Protein: 6.8 g/dL (ref 6.0–8.5)
eGFR: 64 mL/min/{1.73_m2} (ref >59)

## 2022-02-21 LAB — LIPID PANEL WITH LDL/HDL RATIO
Cholesterol, Total: 178 mg/dL (ref 100–199)
HDL: 34 mg/dL — ABNORMAL LOW (ref >39)
LDL Chol Calc (NIH): 122 mg/dL — ABNORMAL HIGH (ref 0–99)
LDL/HDL Ratio: 3.6 ratio — ABNORMAL HIGH (ref 0.0–3.2)
Triglycerides: 120 mg/dL (ref 0–149)
VLDL Cholesterol Cal: 22 mg/dL (ref 5–40)

## 2022-02-21 LAB — RHEUMATOID FACTOR: Rheumatoid fact SerPl-aCnc: 12.1 IU/mL (ref <14.0)

## 2022-02-21 LAB — SEDIMENTATION RATE: Sed Rate: 23 mm/hr (ref 0–40)

## 2022-02-21 LAB — TSH+FREE T4
Free T4: 1.18 ng/dL (ref 0.82–1.77)
TSH: 2.85 u[IU]/mL (ref 0.450–4.500)

## 2022-02-25 ENCOUNTER — Telehealth: Payer: Self-pay

## 2022-02-25 NOTE — Telephone Encounter (Signed)
-----   Message from Mylinda Latina, PA-C sent at 02/25/2022 11:39 AM EDT ----- Please let her know that her cholesterol has improved some but is still elevated. Also her ANA did come back positive which I would recommend a rheumatology referral, but I believe the patient stated she has had this evaluated before? Please verify what was done previously as I would like her to see rheumatology if this finding was not already known and evaluated further by them. Her WBC also is still elevated though stable from last check

## 2022-02-25 NOTE — Telephone Encounter (Signed)
Spoke to pt and gave lab results and asked pt about being referred to rheumatology and she feels that she would like to wait and discuss this further in depth with you at her next scheduled appt with you.  She feels it is in no rush and I informed pt that if anything changes and she feels she needs to be seen before that she can call and move her appt up with you.

## 2022-03-28 ENCOUNTER — Ambulatory Visit (INDEPENDENT_AMBULATORY_CARE_PROVIDER_SITE_OTHER): Payer: Medicare PPO | Admitting: Physician Assistant

## 2022-03-28 ENCOUNTER — Encounter: Payer: Self-pay | Admitting: Physician Assistant

## 2022-03-28 VITALS — BP 135/63 | HR 64 | Temp 98.2°F | Resp 16 | Ht 63.0 in | Wt 184.0 lb

## 2022-03-28 DIAGNOSIS — D72829 Elevated white blood cell count, unspecified: Secondary | ICD-10-CM

## 2022-03-28 DIAGNOSIS — E782 Mixed hyperlipidemia: Secondary | ICD-10-CM

## 2022-03-28 DIAGNOSIS — I10 Essential (primary) hypertension: Secondary | ICD-10-CM

## 2022-03-28 DIAGNOSIS — R768 Other specified abnormal immunological findings in serum: Secondary | ICD-10-CM | POA: Diagnosis not present

## 2022-03-28 DIAGNOSIS — M2550 Pain in unspecified joint: Secondary | ICD-10-CM | POA: Diagnosis not present

## 2022-03-28 NOTE — Progress Notes (Signed)
[facility] [place], [place] [address]  Internal MEDICINE  Office Visit Note  Patient Name: Donna Beasley  [id number]  [id number]  Date of Service: 03/28/2022  Chief Complaint  Patient presents with   Follow-up    Review ultrasound and discuss labwork   Gastroesophageal Reflux   Hypertension   Quality Metric Gaps    Shingles Vaccine    HPI Pt is here for routine follow up -She had been previously notified of her lab results and would like to discuss further. She has had an elevated WBC for years and appears to be stable and only mildly elevated now. She denies any fatigue or wt loss and would rather keep monitoring than see hematology for further evaluation. She has promised to notify office any any symptoms arise or if she changes her mind. -She also had a positive ANA and we discussed possible autoimmune conditions that this can indicate including connective tissue disease and lupus. She reports she was seen by rheumatology many years ago and that they didn't know why she was there and no further workup was indicated. She asked that we monitor this as well to recheck antibody levels rather than seeing rheumatology again. She does have joint pain and relates this to OA, and her RF was negative. -Has had skin conditions before, eczema that evolved some, and wonders if this doesn't relate to something autoimmune or her elevated WBC, but states she is managing this. -Reviewed carotid US which showed <50% stenosis bilaterally with no significant plaque formation -Did also discuss considering Ct chest lung cancer screening. She started smoking at 18, has been smoking off and on since. Did smoke 1 ppd briefly, then would smoke 1/2 ppd for a few days then stop briefly again a few days and start back. Has quit for longer periods, but will resume again. She currently is fluctuating use again and hasnt smoked since Sunday, but thinks she will probably have another at  some point and doesn't consider herself as having quit. She wants to think about CT chest for now and will notify office. Denies SOB or problems with breathing at this time  Current Medication: Outpatient Encounter Medications as of 03/28/2022  Medication Sig   carvedilol (COREG) 25 MG tablet TAKE 1 TABLET BY MOUTH TWICE A DAY   hydrocortisone cream 0.5 % Apply 1 application topically 2 (two) times daily as needed for itching.   lisinopril-hydrochlorothiazide (ZESTORETIC) 20-12.5 MG tablet TAKE 1 TABLET BY MOUTH EVERY DAY   Multiple Vitamin (MULTIVITAMIN) tablet Take 1 tablet by mouth daily.   triamcinolone cream (KENALOG) 0.1 % Apply 1 application topically 2 (two) times daily as needed.   No facility-administered encounter medications on file as of 03/28/2022.    Surgical History: Past Surgical History:  Procedure Laterality Date   BIOPSY N/A 11/19/2020   Procedure: BIOPSY;  Surgeon: Lucilla Lame, MD;  Location: [place];  Service: Endoscopy;  Laterality: N/A;   COLONOSCOPY WITH PROPOFOL N/A 07/02/2015   Procedure: COLONOSCOPY WITH PROPOFOL;  Surgeon: Lucilla Lame, MD;  Location: [place];  Service: Endoscopy;  Laterality: N/A;   COLONOSCOPY WITH PROPOFOL N/A 11/19/2020   Procedure: COLONOSCOPY WITH PROPOFOL;  Surgeon: Lucilla Lame, MD;  Location: [place];  Service: Endoscopy;  Laterality: N/A;   POLYPECTOMY  07/02/2015   Procedure: POLYPECTOMY;  Surgeon: Lucilla Lame, MD;  Location: [place];  Service: Endoscopy;;   POLYPECTOMY N/A 11/19/2020   Procedure: POLYPECTOMY;  Surgeon: Lucilla Lame, MD;  Location: [place];  Service: Endoscopy;  Laterality: N/A;   UTERINE FIBROID SURGERY      Medical History: Past Medical History:  Diagnosis Date   Arthritis    osteo - hands, toes, knees   Atrophic vaginitis    Eczema    GERD (gastroesophageal reflux disease)    occas.   Headache    sinus   Hypertension    Knee pain, right     Motion sickness    boats (sometimes)   Osteoarthritis    Sinusitis     Family History: Family History  Problem Relation Age of Onset   Hypertension Mother    Thyroid disease Mother    Hypertension Father    Breast cancer Neg Hx     Social History   Socioeconomic History   Marital status: Single    Spouse name: Not on file   Number of children: Not on file   Years of education: Not on file   Highest education level: Not on file  Occupational History   Not on file  Tobacco Use   Smoking status: Some Days    Packs/day: 0.50    Types: Cigarettes   Smokeless tobacco: Never   Tobacco comments:    Smokes intermittently  Substance and Sexual Activity   Alcohol use: Not Currently   Drug use: Never   Sexual activity: Not on file  Other Topics Concern   Not on file  Social History Narrative   Not on file   Social Determinants of Health   Financial Resource Strain: Not on file  Food Insecurity: Not on file  Transportation Needs: Not on file  Physical Activity: Not on file  Stress: Not on file  Social Connections: Not on file  Intimate Partner Violence: Not on file      Review of Systems  Constitutional:  Negative for chills, fatigue and unexpected weight change.  HENT:  Negative for congestion, postnasal drip, rhinorrhea, sneezing and sore throat.   Eyes:  Negative for redness.  Respiratory:  Negative for cough, chest tightness and shortness of breath.   Cardiovascular:  Negative for chest pain and palpitations.  Gastrointestinal:  Negative for abdominal pain, constipation, diarrhea, nausea and vomiting.  Genitourinary:  Negative for dysuria and frequency.  Musculoskeletal:  Positive for arthralgias. Negative for back pain, joint swelling and neck pain.  Skin:  Negative for rash.  Neurological: Negative.  Negative for tremors and numbness.  Hematological:  Negative for adenopathy. Does not bruise/bleed easily.  Psychiatric/Behavioral:  Negative for behavioral  problems (Depression), sleep disturbance and suicidal ideas. The patient is not nervous/anxious.     Vital Signs: BP 135/63   Pulse 64   Temp 98.2 F (36.8 C)   Resp 16   Ht '5\' 3"'$  (1.6 m)   Wt 184 lb (83.5 kg)   SpO2 95%   BMI 32.59 kg/m    Physical Exam Vitals and nursing note reviewed.  Constitutional:      General: She is not in acute distress.    Appearance: She is well-developed. She is not diaphoretic.  HENT:     Head: Normocephalic and atraumatic.     Mouth/Throat:     Pharynx: No oropharyngeal exudate.  Eyes:     Pupils: Pupils are equal, round, and reactive to light.  Neck:     Thyroid: No thyromegaly.     Vascular: No JVD.     Trachea: No tracheal deviation.  Cardiovascular:     Rate and Rhythm: Normal  rate and regular rhythm.     Heart sounds: Normal heart sounds. No murmur heard.    No friction rub. No gallop.  Pulmonary:     Effort: Pulmonary effort is normal. No respiratory distress.     Breath sounds: No wheezing or rales.  Chest:     Chest wall: No tenderness.  Breasts:    Right: Normal. No mass.     Left: Normal. No mass.  Abdominal:     General: Bowel sounds are normal.     Palpations: Abdomen is soft.  Musculoskeletal:        General: Normal range of motion.     Cervical back: Normal range of motion and neck supple.  Lymphadenopathy:     Cervical: No cervical adenopathy.  Skin:    General: Skin is warm and dry.  Neurological:     Mental Status: She is alert and oriented to person, place, and time.     Cranial Nerves: No cranial nerve deficit.  Psychiatric:        Behavior: Behavior normal.        Thought Content: Thought content normal.        Judgment: Judgment normal.        Assessment/Plan: 1. Essential hypertension Stable, continue to monitor  2. Leukocytosis, unspecified type Will continue to monitor, discussed considering hematology referral, but pt would like to hold off and will notify office if symptoms arise. - CBC  w/Diff/Platelet  3. Arthralgia, unspecified joint Will recheck labs in a few months for monitoring and consider rheumatology referral if symptoms or labs change - ANA w/Reflex if Positive  4. Positive ANA (antinuclear antibody) Discussed considering rheumatology referral, however pt reports prior visit years ago and nothing came of it. Would like [place] to continue to monitor and she will notify office if any symptoms arise  5. Mixed hyperlipidemia Continue to work on diet and exercise   General Counseling: addi pak understanding of the findings of todays visit and agrees with plan of treatment. I have discussed any further diagnostic evaluation that may be needed or ordered today. We also reviewed her medications today. she has been encouraged to call the office with any questions or concerns that should arise related to todays visit.    Orders Placed This Encounter  Procedures   CBC w/Diff/Platelet   ANA w/Reflex if Positive    No orders of the defined types were placed in this encounter.   This patient was seen by Drema Dallas, PA-C in collaboration with Dr. Clayborn Bigness as a part of collaborative care agreement.   Total time spent:30 Minutes Time spent includes review of chart, medications, test results, and follow up plan with the patient.      Dr Lavera Guise Internal medicine

## 2022-05-12 ENCOUNTER — Other Ambulatory Visit: Payer: Self-pay | Admitting: Physician Assistant

## 2022-05-12 ENCOUNTER — Other Ambulatory Visit: Payer: Self-pay | Admitting: Internal Medicine

## 2022-06-25 IMAGING — MG MM DIGITAL SCREENING BILAT W/ TOMO AND CAD
8 of 14 series · 8 of 40 positions shown · non-contrast
Comparison: Previous exam(s).

CLINICAL DATA: Screening.

EXAM:
DIGITAL SCREENING BILATERAL MAMMOGRAM WITH TOMOSYNTHESIS AND CAD
TECHNIQUE: Bilateral screening digital craniocaudal and mediolateral oblique
mammograms were obtained. Bilateral screening digital breast
tomosynthesis was performed. The images were evaluated with
computer-aided detection.

[R MLO synth-2D (1 of 2)]
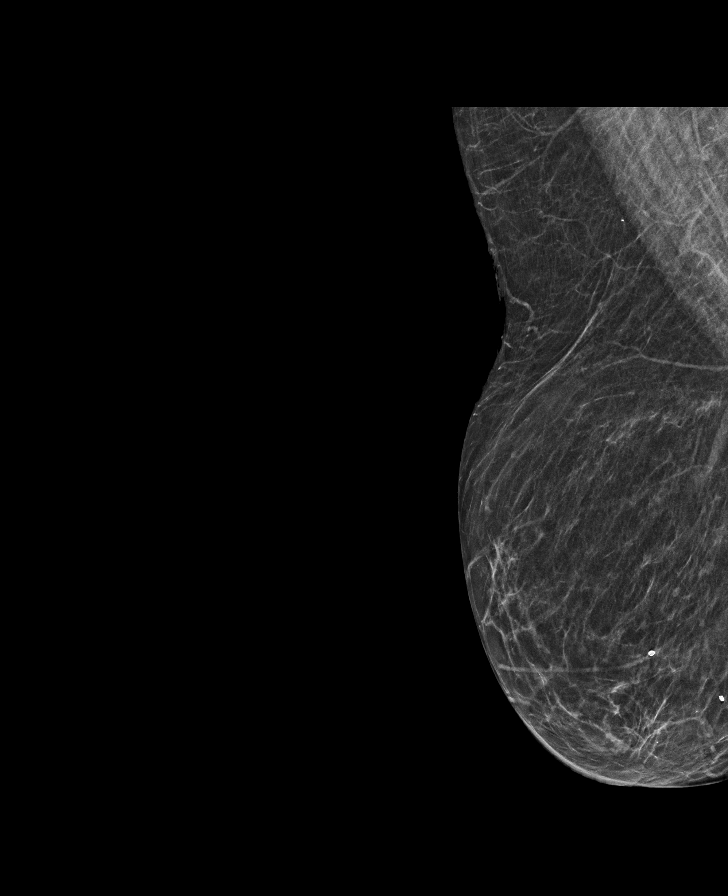

[L CC synth-2D]
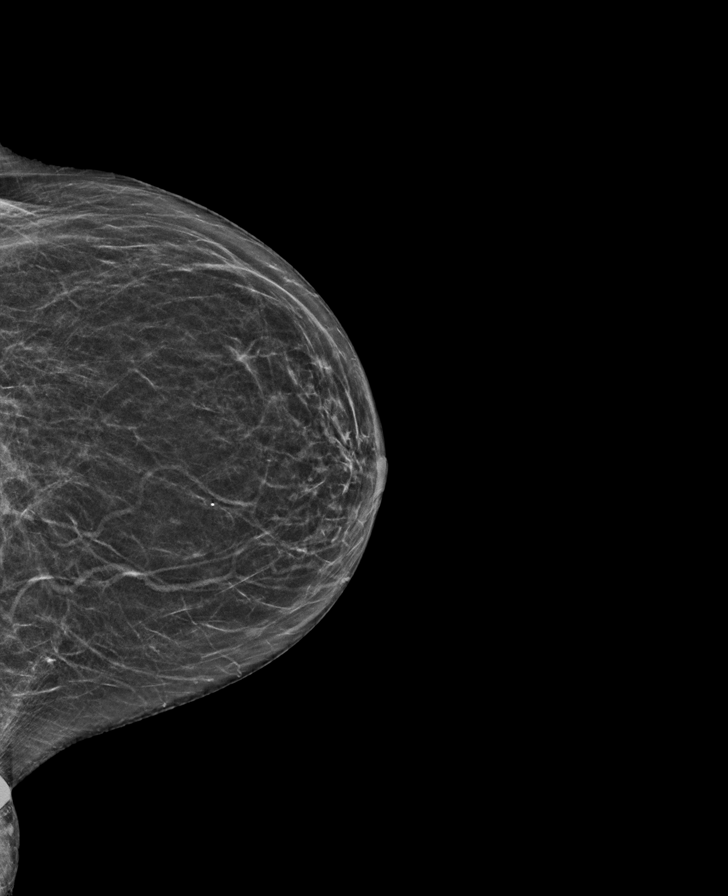

[L MLO synth-2D (1 of 2)]
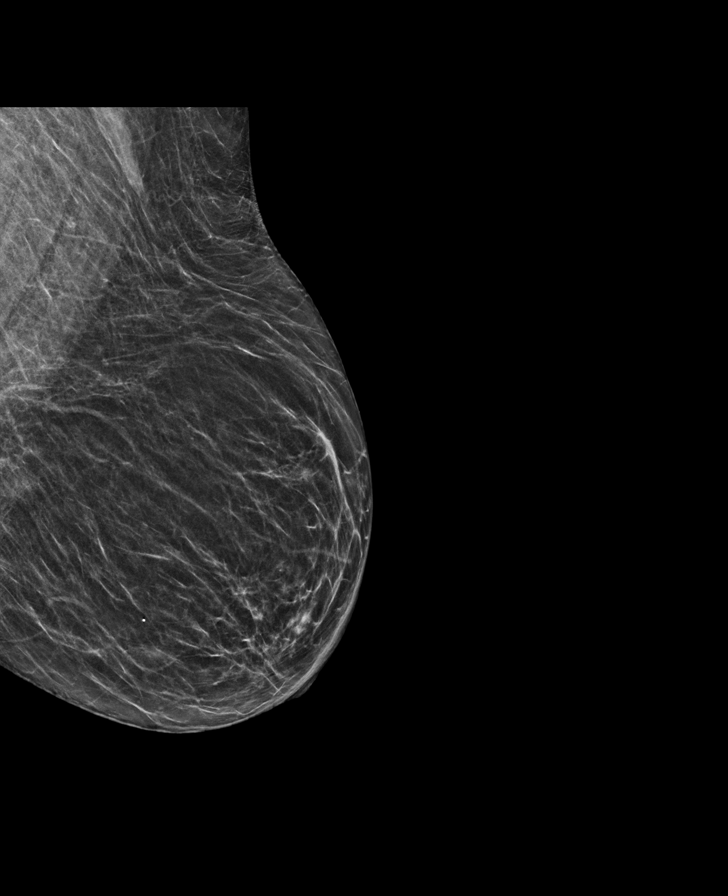

[L MLO synth-2D (2 of 2)]
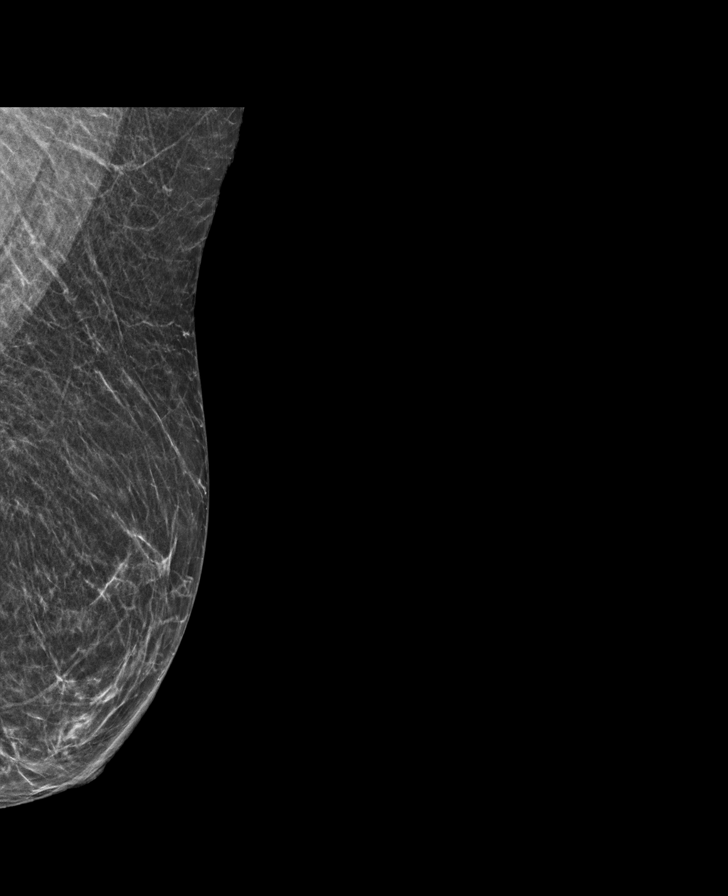

[R MLO synth-2D (2 of 2)]
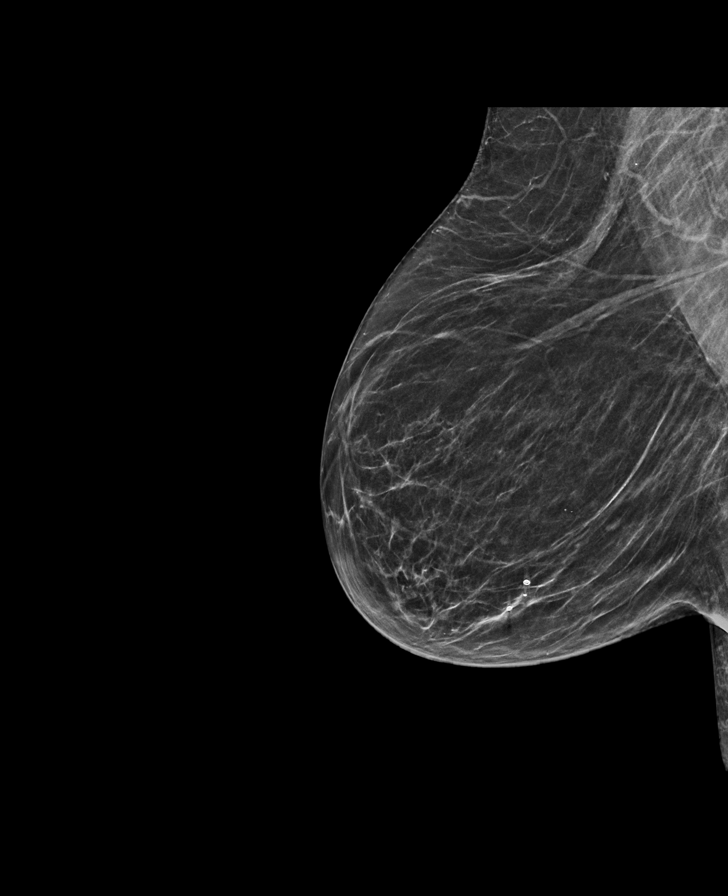

[R CC synth-2D (1 of 2)]
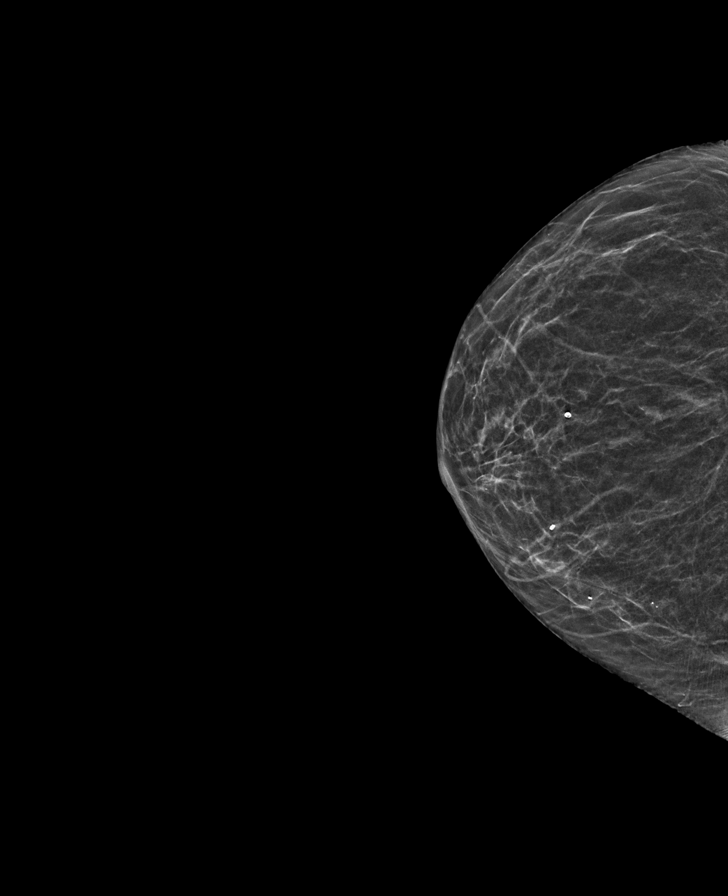

[R CC synth-2D (2 of 2)]
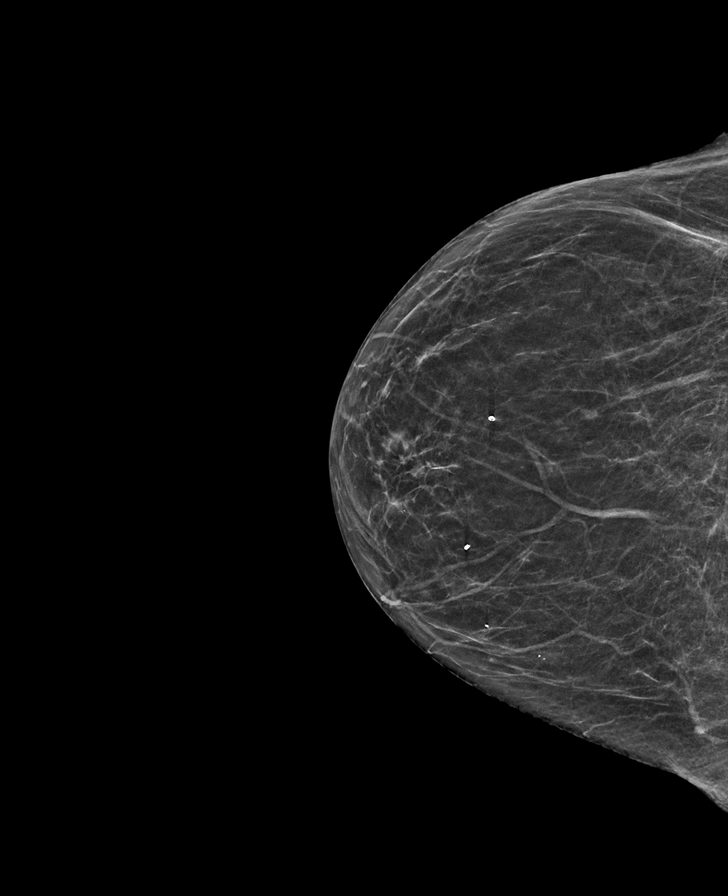

[L CC tomo · tomo slice 27/52.0]
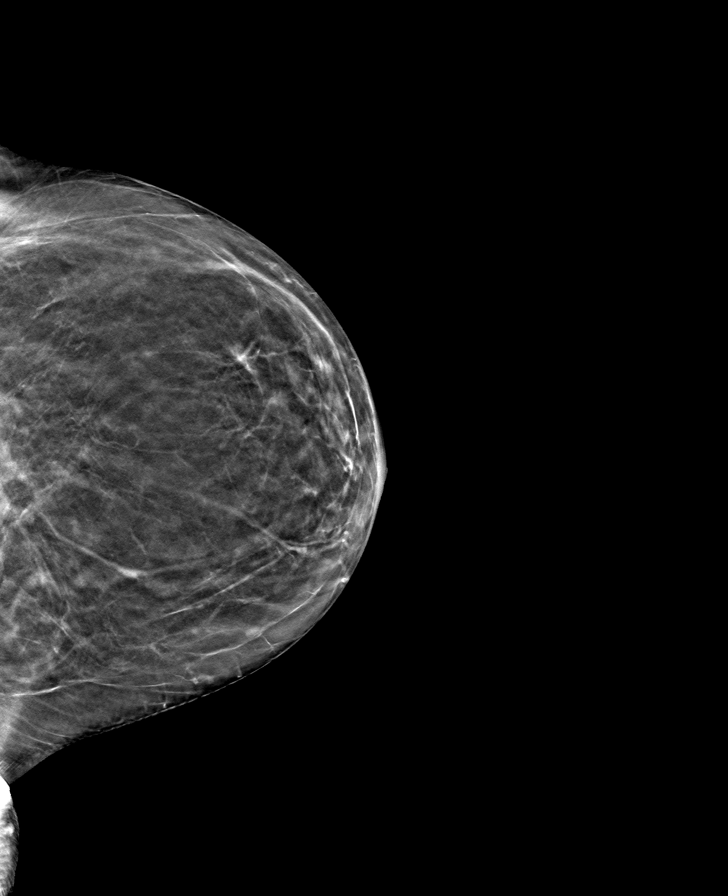

[8 of 40 positions shown; findings below may reference images not displayed]

ACR Breast Density Category b: There are scattered areas of
fibroglandular density.
FINDINGS: There are no findings suspicious for malignancy.
IMPRESSION: No mammographic evidence of malignancy. A result letter of this
screening mammogram will be mailed directly to the patient.

RECOMMENDATION:
Screening mammogram in one year. (Code:51-O-LD2)

BI-RADS CATEGORY  1: Negative.

## 2022-09-12 ENCOUNTER — Ambulatory Visit: Payer: Medicare PPO | Admitting: Physician Assistant

## 2022-09-29 ENCOUNTER — Ambulatory Visit: Payer: Medicare PPO | Admitting: Physician Assistant

## 2022-10-06 DIAGNOSIS — D72829 Elevated white blood cell count, unspecified: Secondary | ICD-10-CM | POA: Diagnosis not present

## 2022-10-06 DIAGNOSIS — M2550 Pain in unspecified joint: Secondary | ICD-10-CM | POA: Diagnosis not present

## 2022-10-07 LAB — CBC WITH DIFFERENTIAL/PLATELET
Basophils Absolute: 0.1 10*3/uL (ref 0.0–0.2)
Basos: 1 %
EOS (ABSOLUTE): 0.5 10*3/uL — ABNORMAL HIGH (ref 0.0–0.4)
Eos: 4 %
Hematocrit: 39.6 % (ref 34.0–46.6)
Hemoglobin: 13.6 g/dL (ref 11.1–15.9)
Immature Grans (Abs): 0 10*3/uL (ref 0.0–0.1)
Immature Granulocytes: 0 %
Lymphocytes Absolute: 2.8 10*3/uL (ref 0.7–3.1)
Lymphs: 22 %
MCH: 31.5 pg (ref 26.6–33.0)
MCHC: 34.3 g/dL (ref 31.5–35.7)
MCV: 92 fL (ref 79–97)
Monocytes Absolute: 1.2 10*3/uL — ABNORMAL HIGH (ref 0.1–0.9)
Monocytes: 10 %
Neutrophils Absolute: 8.2 10*3/uL — ABNORMAL HIGH (ref 1.4–7.0)
Neutrophils: 63 %
Platelets: 386 10*3/uL (ref 150–450)
RBC: 4.32 x10E6/uL (ref 3.77–5.28)
RDW: 12.6 % (ref 11.7–15.4)
WBC: 12.8 10*3/uL — ABNORMAL HIGH (ref 3.4–10.8)

## 2022-10-07 LAB — ANA W/REFLEX IF POSITIVE
Anti JO-1: <0.2 AI (ref 0.0–0.9)
Anti Nuclear Antibody (ANA): POSITIVE — AB
Centromere Ab Screen: <0.2 AI (ref 0.0–0.9)
Chromatin Ab SerPl-aCnc: <0.2 AI (ref 0.0–0.9)
ENA RNP Ab: 1.4 AI — ABNORMAL HIGH (ref 0.0–0.9)
ENA SM Ab Ser-aCnc: <0.2 AI (ref 0.0–0.9)
ENA SSA (RO) Ab: <0.2 AI (ref 0.0–0.9)
ENA SSB (LA) Ab: <0.2 AI (ref 0.0–0.9)
Scleroderma (Scl-70) (ENA) Antibody, IgG: <0.2 AI (ref 0.0–0.9)
dsDNA Ab: 1 IU/mL (ref 0–9)

## 2022-10-28 ENCOUNTER — Telehealth: Payer: Self-pay

## 2022-10-28 ENCOUNTER — Other Ambulatory Visit: Payer: Self-pay | Admitting: Physician Assistant

## 2022-10-28 DIAGNOSIS — D72829 Elevated white blood cell count, unspecified: Secondary | ICD-10-CM

## 2022-10-28 NOTE — Telephone Encounter (Signed)
Pt notified for labs and advised that we placed referral for hematology and send message to [facility]

## 2022-10-28 NOTE — Telephone Encounter (Signed)
-----   Message from Mylinda Latina, PA-C sent at 10/28/2022  1:04 PM EST ----- Looks like patient rescheduled her visit. Labs continue to show elevate WBC and positive ANA similar to before. Please confirm she is still not having any symptoms of fatigue or abnormal weight loss.

## 2022-10-31 ENCOUNTER — Telehealth: Payer: Self-pay

## 2022-10-31 NOTE — Telephone Encounter (Signed)
Attempted to contact patient prior to her new patient appointment with Dr. Tasia Catchings on 11/03/22. Patient did not answer and no voicemail was available.

## 2022-11-03 ENCOUNTER — Inpatient Hospital Stay: Payer: Medicare PPO | Attending: Oncology | Admitting: Oncology

## 2022-11-03 ENCOUNTER — Inpatient Hospital Stay: Payer: Medicare PPO

## 2022-11-03 ENCOUNTER — Encounter: Payer: Self-pay | Admitting: Oncology

## 2022-11-03 VITALS — BP 152/73 | HR 69 | Temp 96.0°F | Wt 191.7 lb

## 2022-11-03 DIAGNOSIS — D72829 Elevated white blood cell count, unspecified: Secondary | ICD-10-CM | POA: Diagnosis not present

## 2022-11-03 DIAGNOSIS — R21 Rash and other nonspecific skin eruption: Secondary | ICD-10-CM | POA: Insufficient documentation

## 2022-11-03 DIAGNOSIS — R768 Other specified abnormal immunological findings in serum: Secondary | ICD-10-CM | POA: Diagnosis not present

## 2022-11-03 DIAGNOSIS — Z720 Tobacco use: Secondary | ICD-10-CM

## 2022-11-03 LAB — CBC WITH DIFFERENTIAL/PLATELET
Abs Immature Granulocytes: 0.05 10*3/uL (ref 0.00–0.07)
Basophils Absolute: 0.1 10*3/uL (ref 0.0–0.1)
Basophils Relative: 1 %
Eosinophils Absolute: 0.4 10*3/uL (ref 0.0–0.5)
Eosinophils Relative: 3 %
HCT: 40.4 % (ref 36.0–46.0)
Hemoglobin: 13.1 g/dL (ref 12.0–15.0)
Immature Granulocytes: 0 %
Lymphocytes Relative: 23 %
Lymphs Abs: 2.7 10*3/uL (ref 0.7–4.0)
MCH: 30.8 pg (ref 26.0–34.0)
MCHC: 32.4 g/dL (ref 30.0–36.0)
MCV: 94.8 fL (ref 80.0–100.0)
Monocytes Absolute: 1.1 10*3/uL — ABNORMAL HIGH (ref 0.1–1.0)
Monocytes Relative: 9 %
Neutro Abs: 7.6 10*3/uL (ref 1.7–7.7)
Neutrophils Relative %: 64 %
Platelets: 298 10*3/uL (ref 150–400)
RBC: 4.26 MIL/uL (ref 3.87–5.11)
RDW: 14.2 % (ref 11.5–15.5)
WBC: 11.8 10*3/uL — ABNORMAL HIGH (ref 4.0–10.5)
nRBC: 0 % (ref 0.0–0.2)

## 2022-11-03 LAB — HEPATITIS PANEL, ACUTE
HCV Ab: NONREACTIVE
Hep A IgM: NONREACTIVE
Hep B C IgM: NONREACTIVE
Hepatitis B Surface Ag: NONREACTIVE

## 2022-11-03 LAB — COMPREHENSIVE METABOLIC PANEL
ALT: 13 U/L (ref 0–44)
AST: 20 U/L (ref 15–41)
Albumin: 3.8 g/dL (ref 3.5–5.0)
Alkaline Phosphatase: 55 U/L (ref 38–126)
Anion gap: 9 (ref 5–15)
BUN: 19 mg/dL (ref 8–23)
CO2: 25 mmol/L (ref 22–32)
Calcium: 8.8 mg/dL — ABNORMAL LOW (ref 8.9–10.3)
Chloride: 99 mmol/L (ref 98–111)
Creatinine, Ser: 0.88 mg/dL (ref 0.44–1.00)
GFR, Estimated: >60 mL/min (ref >60)
Glucose, Bld: 125 mg/dL — ABNORMAL HIGH (ref 70–99)
Potassium: 4.3 mmol/L (ref 3.5–5.1)
Sodium: 133 mmol/L — ABNORMAL LOW (ref 135–145)
Total Bilirubin: 0.4 mg/dL (ref 0.3–1.2)
Total Protein: 7.4 g/dL (ref 6.5–8.1)

## 2022-11-03 LAB — HIV ANTIBODY (ROUTINE TESTING W REFLEX): HIV Screen 4th Generation wRfx: NONREACTIVE

## 2022-11-03 LAB — LACTATE DEHYDROGENASE: LDH: 102 U/L (ref 98–192)

## 2022-11-03 NOTE — Progress Notes (Signed)
Patient is referred here by Drema Dallas, PA for leukocytosis.

## 2022-11-03 NOTE — Assessment & Plan Note (Signed)
Eczema versus psoriasis.  Refer to dermatology for evaluation.

## 2022-11-03 NOTE — Assessment & Plan Note (Signed)
Recommend future rheumatology evaluation.

## 2022-11-03 NOTE — Assessment & Plan Note (Signed)
Recommend smoke cessation.  

## 2022-11-03 NOTE — Assessment & Plan Note (Signed)
Labs reviewed and discussed with patient that Leukocytosis, differential diagnosis is broad, acute or chronic infection and inflammation, smoking, autoimmune disease, or underlying bone marrow disorders.   For the work up of patient's leukocytosis, I recommend checking CBC;CMP, LDH, smear review, peripheral flowcytometry, monoclonal gammopathy workup, hepatitis, HIV. Patient has chronic monocytosis, check BCR-ABL, JAK2 inhibitor with reflex to other mutations.

## 2022-11-03 NOTE — Progress Notes (Signed)
Hematology/Oncology Consult Note [phone] [phone]     REFERRING PROVIDER: Mylinda Latina, PA*    CHIEF COMPLAINTS/PURPOSE OF CONSULTATION:  Leukocytosis  ASSESSMENT & PLAN:   Leukocytosis Labs reviewed and discussed with patient that Leukocytosis, differential diagnosis is broad, acute or chronic infection and inflammation, smoking, autoimmune disease, or underlying bone marrow disorders.   For the work up of patient's leukocytosis, I recommend checking CBC;CMP, LDH, smear review, peripheral flowcytometry, monoclonal gammopathy workup, hepatitis, HIV. Patient has chronic monocytosis, check BCR-ABL, JAK2 inhibitor with reflex to other mutations.   Tobacco use Recommend smoke cessation.  Positive ANA (antinuclear antibody) Recommend future rheumatology evaluation.  Skin rash Eczema versus psoriasis.  Refer to dermatology for evaluation.   Orders Placed This Encounter  Procedures   CBC with Differential/Platelet    Standing Status:   Future    Number of Occurrences:   1    Standing Expiration Date:   11/04/2023   Comprehensive metabolic panel    Standing Status:   Future    Number of Occurrences:   1    Standing Expiration Date:   11/04/2023   Lactate dehydrogenase    Standing Status:   Future    Number of Occurrences:   1    Standing Expiration Date:   11/04/2023   Multiple Myeloma Panel (SPEP&IFE w/QIG)    Standing Status:   Future    Number of Occurrences:   1    Standing Expiration Date:   11/04/2023   Kappa/lambda light chains    Standing Status:   Future    Number of Occurrences:   1    Standing Expiration Date:   11/04/2023   Flow cytometry panel-leukemia/lymphoma work-up    Standing Status:   Future    Number of Occurrences:   1    Standing Expiration Date:   11/04/2023   Hepatitis panel, acute    Standing Status:   Future    Number of Occurrences:   1    Standing Expiration Date:   11/04/2023   HIV Antibody (routine testing  w rflx)    Standing Status:   Future    Number of Occurrences:   1    Standing Expiration Date:   11/04/2023   BCR-ABL1 FISH    Standing Status:   Future    Number of Occurrences:   1    Standing Expiration Date:   11/04/2023   JAK2 V617F rfx CALR/MPL/E12-15    Standing Status:   Future    Number of Occurrences:   1    Standing Expiration Date:   11/04/2023   Ambulatory referral to Dermatology    Referral Priority:   Routine    Referral Type:   Consultation    Referral Reason:   Specialty Services Required    Referred to Provider:   Ralene Bathe, MD    Requested Specialty:   Dermatology    Number of Visits Requested:   1   Follow-up to be determined depending on blood work results. All questions were answered. The patient knows to call the clinic with any problems, questions or concerns.  Earlie Server, MD, PhD [facility] Hematology Oncology 11/03/2022    HISTORY OF PRESENTING ILLNESS:  Donna Beasley 72 y.o. female presents to establish care for leukocytosis  Patient has a chronic history of Leukocytosis, reviewed her previous blood work, leukocytosis dating back to at least 2020 January.  Predominantly lymphocytosis composed of monocytosis, lymphocytosis and eosinophilia.  Patient is  someday smoker.  Chief complaints chronic skin issue, previously diagnosed with eczema.  She also noticed skin plaques, close to the joint area.  She also noticed intermittent nodules on her hands.  Patient has chronic arthralgia, of the knee, shoulder, sometimes enjoys.  Some stiffness in the morning. Denies any unintentional weight loss, night sweats Patient has positive ANA.  She was seen remotely by rheumatology Dr. Jefm Bryant in 2015 and was felt not to have any autoimmune condition at that point.  MEDICAL HISTORY:  Past Medical History:  Diagnosis Date   Arthritis    osteo - hands, toes, knees   Atrophic vaginitis    Eczema    GERD (gastroesophageal reflux disease)    occas.   Headache     sinus   Hypertension    Knee pain, right    Motion sickness    boats (sometimes)   Osteoarthritis    Sinusitis     SURGICAL HISTORY: Past Surgical History:  Procedure Laterality Date   BIOPSY N/A 11/19/2020   Procedure: BIOPSY;  Surgeon: Lucilla Lame, MD;  Location: [place];  Service: Endoscopy;  Laterality: N/A;   COLONOSCOPY WITH PROPOFOL N/A 07/02/2015   Procedure: COLONOSCOPY WITH PROPOFOL;  Surgeon: Lucilla Lame, MD;  Location: [place];  Service: Endoscopy;  Laterality: N/A;   COLONOSCOPY WITH PROPOFOL N/A 11/19/2020   Procedure: COLONOSCOPY WITH PROPOFOL;  Surgeon: Lucilla Lame, MD;  Location: [place];  Service: Endoscopy;  Laterality: N/A;   POLYPECTOMY  07/02/2015   Procedure: POLYPECTOMY;  Surgeon: Lucilla Lame, MD;  Location: [place];  Service: Endoscopy;;   POLYPECTOMY N/A 11/19/2020   Procedure: POLYPECTOMY;  Surgeon: Lucilla Lame, MD;  Location: [place];  Service: Endoscopy;  Laterality: N/A;   UTERINE FIBROID SURGERY      SOCIAL HISTORY: Social History   Socioeconomic History   Marital status: Single    Spouse name: Not on file   Number of children: Not on file   Years of education: Not on file   Highest education level: Not on file  Occupational History   Not on file  Tobacco Use   Smoking status: Some Days    Packs/day: 0.50    Years: 40.00    Total pack years: 20.00    Types: Cigarettes   Smokeless tobacco: Never   Tobacco comments:    Smokes intermittently  Substance and Sexual Activity   Alcohol use: Not Currently   Drug use: Never   Sexual activity: Not on file  Other Topics Concern   Not on file  Social History Narrative   Not on file   Social Determinants of Health   Financial Resource Strain: Low Risk  (11/03/2022)   Overall Financial Resource Strain (CARDIA)    Difficulty of Paying Living Expenses: Not very hard  Food Insecurity: No Food Insecurity (11/03/2022)   Hunger Vital  Sign    Worried About Running Out of Food in the Last Year: Never true    Ran Out of Food in the Last Year: Never true  Transportation Needs: No Transportation Needs (11/03/2022)   PRAPARE - Hydrologist (Medical): No    Lack of Transportation (Non-Medical): No  Physical Activity: Not on file  Stress: No Stress Concern Present (11/03/2022)   [place]    Feeling of Stress : Not at all  Social Connections: Not on file  Intimate Partner Violence: Not At Risk (11/03/2022)  Humiliation, Afraid, Rape, and Kick questionnaire    Fear of Current or Ex-Partner: No    Emotionally Abused: No    Physically Abused: No    Sexually Abused: No    FAMILY HISTORY: Family History  Problem Relation Age of Onset   Hypertension Mother    Thyroid disease Mother    Hypertension Father    Breast cancer Neg Hx     ALLERGIES:  has No Known Allergies.  MEDICATIONS:  Current Outpatient Medications  Medication Sig Dispense Refill   carvedilol (COREG) 25 MG tablet TAKE 1 TABLET BY MOUTH TWICE A DAY 180 tablet 2   hydrocortisone cream 0.5 % Apply 1 application topically 2 (two) times daily as needed for itching.     lisinopril-hydrochlorothiazide (ZESTORETIC) 20-12.5 MG tablet TAKE 1 TABLET BY MOUTH EVERY DAY 90 tablet 1   Multiple Vitamin (MULTIVITAMIN) tablet Take 1 tablet by mouth daily.     triamcinolone cream (KENALOG) 0.1 % Apply 1 application topically 2 (two) times daily as needed.     No current facility-administered medications for this visit.    Review of Systems  Constitutional:  Negative for appetite change, chills, fatigue and fever.  HENT:   Negative for hearing loss and voice change.   Eyes:  Negative for eye problems.  Respiratory:  Negative for chest tightness and cough.   Cardiovascular:  Negative for chest pain.  Gastrointestinal:  Negative for abdominal distention, abdominal pain and  blood in stool.  Endocrine: Negative for hot flashes.  Genitourinary:  Negative for difficulty urinating and frequency.   Musculoskeletal:  Negative for arthralgias.  Skin:  Positive for rash. Negative for itching.  Neurological:  Negative for extremity weakness.  Hematological:  Negative for adenopathy.  Psychiatric/Behavioral:  Negative for confusion.      PHYSICAL EXAMINATION: ECOG PERFORMANCE STATUS: 0 - Asymptomatic  Vitals:   11/03/22 1055  BP: (!) 152/73  Pulse: 69  Temp: (!) 96 F (35.6 C)  SpO2: 98%   Filed Weights   11/03/22 1055  Weight: 191 lb 11.2 oz (87 kg)    Physical Exam Constitutional:      General: She is not in acute distress.    Appearance: She is not diaphoretic.  HENT:     Head: Normocephalic and atraumatic.     Nose: Nose normal.     Mouth/Throat:     Pharynx: No oropharyngeal exudate.  Eyes:     General: No scleral icterus.    Pupils: Pupils are equal, round, and reactive to light.  Cardiovascular:     Rate and Rhythm: Normal rate and regular rhythm.     Heart sounds: No murmur heard. Pulmonary:     Effort: Pulmonary effort is normal. No respiratory distress.     Breath sounds: No rales.  Chest:     Chest wall: No tenderness.  Abdominal:     General: There is no distension.     Palpations: Abdomen is soft.     Tenderness: There is no abdominal tenderness.  Musculoskeletal:        General: Normal range of motion.     Cervical back: Normal range of motion and neck supple.  Skin:    General: Skin is warm and dry.     Findings: No erythema.     Comments: Scaly skin patches over bilateral elbows  Neurological:     Mental Status: She is alert and oriented to person, place, and time.     Cranial Nerves: No cranial nerve deficit.  Motor: No abnormal muscle tone.     Coordination: Coordination normal.  Psychiatric:        Mood and Affect: Affect normal.      LABORATORY DATA:  I have reviewed the data as listed    Latest Ref Rng  & Units 11/03/2022   11:54 AM 10/06/2022    9:00 AM 02/20/2022    8:29 AM  CBC  WBC 4.0 - 10.5 K/uL 11.8  12.8  11.3   Hemoglobin 12.0 - 15.0 g/dL 13.1  13.6  13.3   Hematocrit 36.0 - 46.0 % 40.4  39.6  41.1   Platelets 150 - 400 K/uL 298  386  360       Latest Ref Rng & Units 11/03/2022   11:54 AM 02/20/2022    8:29 AM 05/22/2021    8:22 AM  CMP  Glucose 70 - 99 mg/dL 125  112  123   BUN 8 - 23 mg/dL '19  19  22   '$ Creatinine 0.44 - 1.00 mg/dL 0.88  0.95  0.85   Sodium 135 - 145 mmol/L 133  135  136   Potassium 3.5 - 5.1 mmol/L 4.3  5.0  5.0   Chloride 98 - 111 mmol/L 99  99  100   CO2 22 - 32 mmol/L '25  21  23   '$ Calcium 8.9 - 10.3 mg/dL 8.8  8.9  9.3   Total Protein 6.5 - 8.1 g/dL 7.4  6.8  6.8   Total Bilirubin 0.3 - 1.2 mg/dL 0.4  0.2  0.2   Alkaline Phos 38 - 126 U/L 55  69  72   AST 15 - 41 U/L '20  12  15   '$ ALT 0 - 44 U/L '13  8  11      '$ RADIOGRAPHIC STUDIES: I have personally reviewed the radiological images as listed and agreed with the findings in the report. No results found.

## 2022-11-04 ENCOUNTER — Telehealth: Payer: Self-pay | Admitting: *Deleted

## 2022-11-04 LAB — KAPPA/LAMBDA LIGHT CHAINS
Kappa free light chain: 48 mg/L — ABNORMAL HIGH (ref 3.3–19.4)
Kappa, lambda light chain ratio: 2.14 — ABNORMAL HIGH (ref 0.26–1.65)
Lambda free light chains: 22.4 mg/L (ref 5.7–26.3)

## 2022-11-04 NOTE — Telephone Encounter (Signed)
Patient called concerned that the Dermatology appointment has been scheduled for 1 year from now. She asked them about it and she was told that all new patient appointments with Dr Alveria Apley group are scheduled a year out. She thought Dr Tasia Catchings wanted her seen sooner than a year. She is willing to see any of the dermatologists in our area including [place] area. Please advise.

## 2022-11-05 LAB — COMP PANEL: LEUKEMIA/LYMPHOMA

## 2022-11-06 ENCOUNTER — Other Ambulatory Visit: Payer: Self-pay | Admitting: Physician Assistant

## 2022-11-06 LAB — BCR-ABL1 FISH
Cells Analyzed: 200
Cells Counted: 200

## 2022-11-10 LAB — MULTIPLE MYELOMA PANEL, SERUM
Albumin SerPl Elph-Mcnc: 3.5 g/dL (ref 2.9–4.4)
Albumin/Glob SerPl: 1.1 (ref 0.7–1.7)
Alpha 1: 0.2 g/dL (ref 0.0–0.4)
Alpha2 Glob SerPl Elph-Mcnc: 0.9 g/dL (ref 0.4–1.0)
B-Globulin SerPl Elph-Mcnc: 0.9 g/dL (ref 0.7–1.3)
Gamma Glob SerPl Elph-Mcnc: 1.3 g/dL (ref 0.4–1.8)
Globulin, Total: 3.3 g/dL (ref 2.2–3.9)
IgA: 254 mg/dL (ref 64–422)
IgG (Immunoglobin G), Serum: 1353 mg/dL (ref 586–1602)
IgM (Immunoglobulin M), Srm: 83 mg/dL (ref 26–217)
Total Protein ELP: 6.8 g/dL (ref 6.0–8.5)

## 2022-11-11 LAB — CALR +MPL + E12-E15  (REFLEX)

## 2022-11-11 LAB — JAK2 V617F RFX CALR/MPL/E12-15

## 2022-11-13 ENCOUNTER — Telehealth: Payer: Self-pay

## 2022-11-13 NOTE — Telephone Encounter (Signed)
Unable to reach pt by phone, detailed VM left and mychart message sent.  

## 2022-11-13 NOTE — Telephone Encounter (Signed)
-----   Message from Earlie Server, MD sent at 11/11/2022 10:49 PM EST ----- Please let her know that blood work results are good over all. Elevated wbc is likely reactive due to chronic inflammation. One of the test - light chain ratio is slightly elevated, non specific, please arrange her to get 24h UPEP Thanks.

## 2022-11-14 NOTE — Telephone Encounter (Signed)
Pt scheduled for [facility] dermatology in April

## 2022-11-14 NOTE — Telephone Encounter (Signed)
Spoke to pt who states that she has 2 issues that she can correlate to the chronic inflammation: 1) a skin condition- followed by dermatology. 2) chronic bad knees most likely related to inflammation. She would like to know if 24hr urine is still needed.   Spoke with Dr. Tasia Catchings and she recommends to still obtain urine test. Call returned to pt and detailed VM left to proceed with urine testing.

## 2022-11-17 ENCOUNTER — Other Ambulatory Visit: Payer: Self-pay

## 2022-11-17 DIAGNOSIS — D72829 Elevated white blood cell count, unspecified: Secondary | ICD-10-CM

## 2022-11-20 ENCOUNTER — Inpatient Hospital Stay: Payer: Medicare PPO | Attending: Oncology

## 2022-11-20 ENCOUNTER — Other Ambulatory Visit: Payer: Medicare PPO

## 2022-11-20 DIAGNOSIS — D72829 Elevated white blood cell count, unspecified: Secondary | ICD-10-CM | POA: Insufficient documentation

## 2022-11-21 LAB — IFE+PROTEIN ELECTRO, 24-HR UR
% BETA, Urine: 0 %
ALPHA 1 URINE: 0 %
Albumin, U: 100 %
Alpha 2, Urine: 0 %
GAMMA GLOBULIN URINE: 0 %
Total Protein, Urine-Ur/day: 74 mg/24 hr (ref 30–150)
Total Protein, Urine: 4.6 mg/dL
Total Volume: 1600

## 2022-11-27 ENCOUNTER — Telehealth: Payer: Self-pay

## 2022-11-27 NOTE — Telephone Encounter (Signed)
-----   Message from Earlie Server, MD sent at 11/27/2022  9:18 AM EDT ----- Urine result is good. No need to follow up

## 2022-11-27 NOTE — Telephone Encounter (Signed)
Called pt, no answer. Detailed VM and mychart message sent with results.

## 2022-12-15 DIAGNOSIS — H2513 Age-related nuclear cataract, bilateral: Secondary | ICD-10-CM | POA: Diagnosis not present

## 2022-12-15 DIAGNOSIS — Z0100 Encounter for examination of eyes and vision without abnormal findings: Secondary | ICD-10-CM | POA: Diagnosis not present

## 2022-12-15 DIAGNOSIS — H353131 Nonexudative age-related macular degeneration, bilateral, early dry stage: Secondary | ICD-10-CM | POA: Diagnosis not present

## 2022-12-31 DIAGNOSIS — M721 Knuckle pads: Secondary | ICD-10-CM | POA: Diagnosis not present

## 2022-12-31 DIAGNOSIS — L2089 Other atopic dermatitis: Secondary | ICD-10-CM | POA: Diagnosis not present

## 2023-01-28 ENCOUNTER — Other Ambulatory Visit: Payer: Self-pay | Admitting: Internal Medicine

## 2023-01-28 DIAGNOSIS — Z1231 Encounter for screening mammogram for malignant neoplasm of breast: Secondary | ICD-10-CM

## 2023-02-10 ENCOUNTER — Other Ambulatory Visit: Payer: Self-pay | Admitting: Physician Assistant

## 2023-02-24 ENCOUNTER — Ambulatory Visit
Admission: RE | Admit: 2023-02-24 | Discharge: 2023-02-24 | Disposition: A | Discharge status: Home / Self Care | Payer: Medicare PPO | Source: Ambulatory Visit | Attending: Internal Medicine | Admitting: Internal Medicine

## 2023-02-24 DIAGNOSIS — Z1231 Encounter for screening mammogram for malignant neoplasm of breast: Secondary | ICD-10-CM | POA: Insufficient documentation

## 2023-04-03 ENCOUNTER — Ambulatory Visit (INDEPENDENT_AMBULATORY_CARE_PROVIDER_SITE_OTHER): Payer: Medicare PPO | Admitting: Physician Assistant

## 2023-04-03 ENCOUNTER — Encounter: Payer: Self-pay | Admitting: Physician Assistant

## 2023-04-03 VITALS — BP 138/88 | HR 66 | Temp 98.1°F | Resp 16 | Ht 63.0 in | Wt 192.8 lb

## 2023-04-03 DIAGNOSIS — R300 Dysuria: Secondary | ICD-10-CM

## 2023-04-03 DIAGNOSIS — E782 Mixed hyperlipidemia: Secondary | ICD-10-CM

## 2023-04-03 DIAGNOSIS — R5383 Other fatigue: Secondary | ICD-10-CM

## 2023-04-03 DIAGNOSIS — E1159 Type 2 diabetes mellitus with other circulatory complications: Secondary | ICD-10-CM

## 2023-04-03 DIAGNOSIS — Z0000 Encounter for general adult medical examination without abnormal findings: Secondary | ICD-10-CM

## 2023-04-03 DIAGNOSIS — R946 Abnormal results of thyroid function studies: Secondary | ICD-10-CM

## 2023-04-03 NOTE — Progress Notes (Signed)
[facility] [address] [place], [place] [address]  Internal MEDICINE  Office Visit Note  Patient Name: Donna Beasley  [id number]  [id number]  Date of Service: 04/03/2023  Chief Complaint  Patient presents with   Gastroesophageal Reflux   Hypertension   Medicare Wellness    HPI Ainara presents for an annual well visit and physical exam.  Well-appearing 72 y.o. female  Routine CRC screening: Done 11/20/22, due 2029 Routine mammogram: Done 02/24/23 DEXA scan: completed Eye exam: Followed by [place] eye Labs: ordered Other concerns: Will get shingles vaccine. Declines CT chest for lung cancer screening. 2packs on weekend currently, but that's it. Understands consequences of not screening     04/03/2023    9:02 AM 09/27/2021    9:02 AM 09/27/2020    8:40 AM  MMSE - Mini Mental State Exam  Orientation to time 5 5 5   Orientation to Place 5 5 5   Registration 3 3 3   Attention/ Calculation 5 5 5   Recall 3 3 3   Language- name 2 objects 2 2 2   Language- repeat 1 1 1   Language- follow 3 step command 3 3 3   Language- read & follow direction 1 1 1   Write a sentence 1 1 1   Copy design 1 1 1   Total score 30 30 30     Functional Status Survey: Is the patient deaf or have difficulty hearing?: No Does the patient have difficulty seeing, even when wearing glasses/contacts?: No Does the patient have difficulty concentrating, remembering, or making decisions?: No Does the patient have difficulty walking or climbing stairs?: Yes Does the patient have difficulty dressing or bathing?: No Does the patient have difficulty doing errands alone such as visiting a doctor's office or shopping?: Yes     11/19/2020    8:12 AM 03/28/2021   10:02 AM 09/27/2021    9:01 AM 03/28/2022    8:38 AM 04/03/2023    9:01 AM  Fall Risk  Falls in the past year?  0 0 0 0  Was there an injury with Fall?     0  Fall Risk Category Calculator     0  (RETIRED) Patient Fall Risk Level Low fall risk       Patient at Risk for Falls Due to  No Fall Risks   No Fall Risks  Fall risk Follow up  Falls evaluation completed   Falls evaluation completed       04/03/2023    9:01 AM  Depression screen PHQ 2/9  Decreased Interest 0  Down, Depressed, Hopeless 0  PHQ - 2 Score 0        No data to display            Current Medication: Outpatient Encounter Medications as of 04/03/2023  Medication Sig   carvedilol (COREG) 25 MG tablet TAKE 1 TABLET BY MOUTH TWICE A DAY   hydrocortisone cream 0.5 % Apply 1 application topically 2 (two) times daily as needed for itching.   lisinopril-hydrochlorothiazide (ZESTORETIC) 20-12.5 MG tablet TAKE 1 TABLET BY MOUTH EVERY DAY   Multiple Vitamin (MULTIVITAMIN) tablet Take 1 tablet by mouth daily.   triamcinolone cream (KENALOG) 0.1 % Apply 1 application topically 2 (two) times daily as needed.   No facility-administered encounter medications on file as of 04/03/2023.    Surgical History: Past Surgical History:  Procedure Laterality Date   BIOPSY N/A 11/19/2020   Procedure: BIOPSY;  Surgeon: Midge Minium, MD;  Location: [facility];  Service: Endoscopy;  Laterality: N/A;   COLONOSCOPY WITH PROPOFOL N/A 07/02/2015   Procedure: COLONOSCOPY WITH PROPOFOL;  Surgeon: Midge Minium, MD;  [facility];  Service: Endoscopy;  Laterality: N/A;   COLONOSCOPY WITH PROPOFOL N/A 11/19/2020   Procedure: COLONOSCOPY WITH PROPOFOL;  Surgeon: Midge Minium, MD;  Location: [facility];  Service: Endoscopy;  Laterality: N/A;   POLYPECTOMY  07/02/2015   Procedure: POLYPECTOMY;  Surgeon: Midge Minium, MD;  Location: [facility];  Service: Endoscopy;;   POLYPECTOMY N/A 11/19/2020   Procedure: POLYPECTOMY;  Surgeon: Midge Minium, MD;  Location: [facility];  Service: Endoscopy;  Laterality: N/A;   UTERINE FIBROID SURGERY      Medical History: Past Medical History:  Diagnosis Date   Arthritis    osteo - hands, toes, knees    Atrophic vaginitis    Eczema    GERD (gastroesophageal reflux disease)    occas.   Headache    sinus   Hypertension    Knee pain, right    Motion sickness    boats (sometimes)   Osteoarthritis    Sinusitis     Family History: Family History  Problem Relation Age of Onset   Hypertension Mother    Thyroid disease Mother    Hypertension Father    Breast cancer Neg Hx     Social History   Socioeconomic History   Marital status: Single    Spouse name: Not on file   Number of children: Not on file   Years of education: Not on file   Highest education level: Not on file  Occupational History   Not on file  Tobacco Use   Smoking status: Some Days    Current packs/day: 0.50    Average packs/day: 0.5 packs/day for 40.0 years (20.0 ttl pk-yrs)    Types: Cigarettes   Smokeless tobacco: Never   Tobacco comments:    Smokes intermittently  Substance and Sexual Activity   Alcohol use: Not Currently   Drug use: Never   Sexual activity: Not on file  Other Topics Concern   Not on file  Social History Narrative   Not on file   Social Determinants of Health   Financial Resource Strain: Low Risk  (11/03/2022)   Overall Financial Resource Strain (CARDIA)    Difficulty of Paying Living Expenses: Not very hard  Food Insecurity: No Food Insecurity (11/03/2022)   Hunger Vital Sign    Worried About Running Out of Food in the Last Year: Never true    Ran Out of Food in the Last Year: Never true  Transportation Needs: No Transportation Needs (11/03/2022)   PRAPARE - Administrator, Civil Service (Medical): No    Lack of Transportation (Non-Medical): No  Physical Activity: Not on file  Stress: No Stress Concern Present (11/03/2022)   [employer] of Occupational Health - Occupational Stress Questionnaire    Feeling of Stress : Not at all  Social Connections: Not on file  Intimate Partner Violence: Not At Risk (11/03/2022)   Humiliation, Afraid, Rape, and Kick  questionnaire    Fear of Current or Ex-Partner: No    Emotionally Abused: No    Physically Abused: No    Sexually Abused: No      Review of Systems  Constitutional:  Negative for chills, fatigue and unexpected weight change.  HENT:  Negative for congestion, postnasal drip, rhinorrhea, sneezing and sore throat.   Eyes:  Negative for redness.  Respiratory:  Negative for cough, chest tightness  and shortness of breath.   Cardiovascular:  Negative for chest pain and palpitations.  Gastrointestinal:  Negative for abdominal pain, constipation, diarrhea, nausea and vomiting.  Genitourinary:  Negative for dysuria and frequency.  Musculoskeletal:  Positive for arthralgias. Negative for back pain, joint swelling and neck pain.  Skin:  Negative for rash.  Neurological: Negative.  Negative for tremors and numbness.  Hematological:  Negative for adenopathy. Does not bruise/bleed easily.  Psychiatric/Behavioral:  Negative for behavioral problems (Depression), sleep disturbance and suicidal ideas. The patient is not nervous/anxious.     Vital Signs: BP 138/88   Pulse 66   Temp 98.1 F (36.7 C)   Resp 16   Ht 5\' 3"  (1.6 m)   Wt 192 lb 12.8 oz (87.5 kg)   SpO2 99%   BMI 34.15 kg/m    Physical Exam Vitals and nursing note reviewed.  Constitutional:      General: She is not in acute distress.    Appearance: She is well-developed. She is not diaphoretic.  HENT:     Head: Normocephalic and atraumatic.     Mouth/Throat:     Pharynx: No oropharyngeal exudate.  Eyes:     Pupils: Pupils are equal, round, and reactive to light.  Neck:     Thyroid: No thyromegaly.     Vascular: No JVD.     Trachea: No tracheal deviation.  Cardiovascular:     Rate and Rhythm: Normal rate and regular rhythm.     Heart sounds: Normal heart sounds. No murmur heard.    No friction rub. No gallop.  Pulmonary:     Effort: Pulmonary effort is normal. No respiratory distress.     Breath sounds: No wheezing or  rales.  Chest:     Chest wall: No tenderness.  Breasts:    Right: Normal. No mass.     Left: Normal. No mass.  Abdominal:     General: Bowel sounds are normal.     Palpations: Abdomen is soft.     Tenderness: There is no abdominal tenderness.  Musculoskeletal:        General: Normal range of motion.     Cervical back: Normal range of motion and neck supple.  Lymphadenopathy:     Cervical: No cervical adenopathy.  Skin:    General: Skin is warm and dry.  Neurological:     Mental Status: She is alert and oriented to person, place, and time.     Cranial Nerves: No cranial nerve deficit.  Psychiatric:        Behavior: Behavior normal.        Thought Content: Thought content normal.        Judgment: Judgment normal.        Assessment/Plan: 1. Routine general medical examination at a health care facility Will update shingles vaccines, declines lung cancer screening, otherwise UTD on PHM - CBC w/Diff/Platelet - Comprehensive metabolic panel - TSH + free T4 - Lipid Panel With LDL/HDL Ratio - Hgb A1C w/o eAG  2. Mixed hyperlipidemia - Lipid Panel With LDL/HDL Ratio  3. Abnormal thyroid exam - TSH + free T4  4. Type 2 diabetes mellitus with other circulatory complications (HCC) - Hgb A1C w/o eAG  5. Other fatigue - CBC w/Diff/Platelet - Comprehensive metabolic panel - TSH + free T4 - Lipid Panel With LDL/HDL Ratio - Hgb A1C w/o eAG     General Counseling: rhilyn bertin understanding of the findings of todays visit and agrees with plan of treatment. I  have discussed any further diagnostic evaluation that may be needed or ordered today. We also reviewed her medications today. she has been encouraged to call the office with any questions or concerns that should arise related to todays visit.    Orders Placed This Encounter  Procedures   CBC w/Diff/Platelet   Comprehensive metabolic panel   TSH + free T4   Lipid Panel With LDL/HDL Ratio   Hgb A1C w/o eAG     No orders of the defined types were placed in this encounter.   No follow-ups on file.   Total time spent:35 Minutes Time spent includes review of chart, medications, test results, and follow up plan with the patient.   [place] Controlled Substance Database was reviewed by me.  This patient was seen by Lynn Ito, PA-C in collaboration with Dr. Beverely Risen as a part of collaborative care agreement.  Lynn Ito, PA-C Internal medicine

## 2023-04-03 NOTE — Addendum Note (Signed)
Addended by: Norva Pavlov on: 04/03/2023 01:18 PM   Modules accepted: Orders

## 2023-04-27 ENCOUNTER — Ambulatory Visit: Payer: Medicare PPO

## 2023-04-27 DIAGNOSIS — R300 Dysuria: Secondary | ICD-10-CM | POA: Diagnosis not present

## 2023-04-27 NOTE — Progress Notes (Signed)
Patient here for repeat U/A since blood resulted in the last test.

## 2023-04-28 LAB — UA/M W/RFLX CULTURE, ROUTINE
Bilirubin, UA: NEGATIVE
Glucose, UA: NEGATIVE
Ketones, UA: NEGATIVE
Leukocytes,UA: NEGATIVE
Nitrite, UA: NEGATIVE
Protein,UA: NEGATIVE
RBC, UA: NEGATIVE
Specific Gravity, UA: 1.012 (ref 1.005–1.030)
Urobilinogen, Ur: 0.2 mg/dL (ref 0.2–1.0)
pH, UA: 6 (ref 5.0–7.5)

## 2023-04-28 LAB — MICROSCOPIC EXAMINATION
Casts: NONE SEEN /LPF
Epithelial Cells (non renal): >10 /HPF — AB (ref 0–10)
RBC, Urine: NONE SEEN /HPF (ref 0–2)
WBC, UA: NONE SEEN /hpf (ref 0–5)

## 2023-05-08 ENCOUNTER — Telehealth: Payer: Self-pay

## 2023-05-08 NOTE — Telephone Encounter (Signed)
Sent MyChart message to patient regarding normal U/A.

## 2023-05-08 NOTE — Telephone Encounter (Signed)
-----   Message from Carlean Jews sent at 05/06/2023 11:50 AM EDT ----- Please let her know that there was no blood seen on repeat urine

## 2023-05-09 ENCOUNTER — Other Ambulatory Visit: Payer: Self-pay | Admitting: Physician Assistant

## 2023-06-15 DIAGNOSIS — H538 Other visual disturbances: Secondary | ICD-10-CM | POA: Diagnosis not present

## 2023-06-15 DIAGNOSIS — H353131 Nonexudative age-related macular degeneration, bilateral, early dry stage: Secondary | ICD-10-CM | POA: Diagnosis not present

## 2023-06-15 DIAGNOSIS — H2513 Age-related nuclear cataract, bilateral: Secondary | ICD-10-CM | POA: Diagnosis not present

## 2023-06-15 DIAGNOSIS — M3501 Sicca syndrome with keratoconjunctivitis: Secondary | ICD-10-CM | POA: Diagnosis not present

## 2023-06-29 DIAGNOSIS — H2511 Age-related nuclear cataract, right eye: Secondary | ICD-10-CM | POA: Diagnosis not present

## 2023-06-30 ENCOUNTER — Encounter: Payer: Self-pay | Admitting: Ophthalmology

## 2023-07-03 NOTE — Discharge Instructions (Signed)

## 2023-07-07 ENCOUNTER — Ambulatory Visit: Payer: Medicare PPO | Admitting: Anesthesiology

## 2023-07-07 ENCOUNTER — Encounter
Admission: RE | Disposition: A | Discharge status: Home / Self Care | Payer: Self-pay | Source: Home / Self Care | Attending: Ophthalmology

## 2023-07-07 ENCOUNTER — Other Ambulatory Visit: Payer: Self-pay

## 2023-07-07 ENCOUNTER — Encounter: Payer: Self-pay | Admitting: Ophthalmology

## 2023-07-07 ENCOUNTER — Ambulatory Visit
Admission: RE | Admit: 2023-07-07 | Discharge: 2023-07-07 | Disposition: A | Discharge status: Home / Self Care | Payer: Medicare PPO | Attending: Ophthalmology | Admitting: Ophthalmology

## 2023-07-07 DIAGNOSIS — Z87891 Personal history of nicotine dependence: Secondary | ICD-10-CM | POA: Diagnosis not present

## 2023-07-07 DIAGNOSIS — H2511 Age-related nuclear cataract, right eye: Secondary | ICD-10-CM | POA: Diagnosis not present

## 2023-07-07 DIAGNOSIS — I10 Essential (primary) hypertension: Secondary | ICD-10-CM | POA: Diagnosis not present

## 2023-07-07 DIAGNOSIS — E1136 Type 2 diabetes mellitus with diabetic cataract: Secondary | ICD-10-CM | POA: Diagnosis not present

## 2023-07-07 HISTORY — DX: Prediabetes: R73.03

## 2023-07-07 HISTORY — PX: CATARACT EXTRACTION W/PHACO: SHX586

## 2023-07-07 SURGERY — PHACOEMULSIFICATION, CATARACT, WITH IOL INSERTION
Anesthesia: Monitor Anesthesia Care | Site: Eye | Laterality: Right

## 2023-07-07 MED ORDER — SIGHTPATH DOSE#1 BSS IO SOLN
INTRAOCULAR | Status: DC | PRN
  Administered 2023-07-07: 1 mL

## 2023-07-07 MED ORDER — SIGHTPATH DOSE#1 NA CHONDROIT SULF-NA HYALURON 40-17 MG/ML IO SOLN
INTRAOCULAR | Status: DC | PRN
  Administered 2023-07-07: 1 mL via INTRAOCULAR

## 2023-07-07 MED ORDER — SIGHTPATH DOSE#1 BSS IO SOLN
INTRAOCULAR | Status: DC | PRN
  Administered 2023-07-07: 74 mL via OPHTHALMIC

## 2023-07-07 MED ORDER — LACTATED RINGERS IV SOLN: INTRAVENOUS | Status: DC

## 2023-07-07 MED ORDER — TETRACAINE HCL 0.5 % OP SOLN
1.0000 [drp] | OPHTHALMIC | Status: DC | PRN
  Administered 2023-07-07 (×3): 1 [drp] via OPHTHALMIC

## 2023-07-07 MED ORDER — ARMC OPHTHALMIC DILATING DROPS
1.0000 | OPHTHALMIC | Status: DC | PRN
  Administered 2023-07-07 (×3): 1 via OPHTHALMIC

## 2023-07-07 MED ORDER — ARMC OPHTHALMIC DILATING DROPS
OPHTHALMIC | Status: AC
  Filled 2023-07-07: qty 0.5

## 2023-07-07 MED ORDER — MOXIFLOXACIN HCL 0.5 % OP SOLN
OPHTHALMIC | Status: DC | PRN
  Administered 2023-07-07: .2 mL via OPHTHALMIC

## 2023-07-07 MED ORDER — FENTANYL CITRATE (PF) 100 MCG/2ML IJ SOLN
INTRAMUSCULAR | Status: DC | PRN
  Administered 2023-07-07: 50 ug via INTRAVENOUS

## 2023-07-07 MED ORDER — FENTANYL CITRATE (PF) 100 MCG/2ML IJ SOLN
INTRAMUSCULAR | Status: AC
  Filled 2023-07-07: qty 2

## 2023-07-07 MED ORDER — SIGHTPATH DOSE#1 BSS IO SOLN
INTRAOCULAR | Status: DC | PRN
  Administered 2023-07-07: 15 mL

## 2023-07-07 MED ORDER — BRIMONIDINE TARTRATE-TIMOLOL 0.2-0.5 % OP SOLN
OPHTHALMIC | Status: DC | PRN
  Administered 2023-07-07: 1 [drp] via OPHTHALMIC

## 2023-07-07 MED ORDER — MIDAZOLAM HCL 2 MG/2ML IJ SOLN
INTRAMUSCULAR | Status: DC | PRN
  Administered 2023-07-07: 2 mg via INTRAVENOUS

## 2023-07-07 MED ORDER — TETRACAINE HCL 0.5 % OP SOLN
OPHTHALMIC | Status: AC
  Filled 2023-07-07: qty 4

## 2023-07-07 MED ORDER — MIDAZOLAM HCL 2 MG/2ML IJ SOLN
INTRAMUSCULAR | Status: AC
  Filled 2023-07-07: qty 2

## 2023-07-07 SURGICAL SUPPLY — 17 items
ANGLE REVERSE CUT SHRT 25GA (CUTTER) ×1
CANNULA ANT/CHMB 27G (MISCELLANEOUS) IMPLANT
CANNULA ANT/CHMB 27GA (MISCELLANEOUS)
CATARACT SUITE SIGHTPATH (MISCELLANEOUS) ×1
CYSTOTOME ANGL RVRS SHRT 25G (CUTTER) ×1 IMPLANT
FEE CATARACT SUITE SIGHTPATH (MISCELLANEOUS) ×1 IMPLANT
GLOVE BIOGEL PI IND STRL 8 (GLOVE) ×1 IMPLANT
GLOVE SURG LX STRL 8.0 MICRO (GLOVE) ×1 IMPLANT
LENS CLAREON VIVITY TORIC 175 ×1 IMPLANT
LENS IOL CLRN VT TRC 3 17.5 IMPLANT
NDL FILTER BLUNT 18X1 1/2 (NEEDLE) ×1 IMPLANT
NEEDLE FILTER BLUNT 18X1 1/2 (NEEDLE) ×1
PACK VIT ANT 23G (MISCELLANEOUS) IMPLANT
RING MALYGIN (MISCELLANEOUS) IMPLANT
SUT ETHILON 10-0 CS-B-6CS-B-6 (SUTURE)
SUTURE EHLN 10-0 CS-B-6CS-B-6 (SUTURE) IMPLANT
SYR 3ML LL SCALE MARK (SYRINGE) ×1 IMPLANT

## 2023-07-07 NOTE — Op Note (Signed)
PREOPERATIVE DIAGNOSIS:  Nuclear sclerotic cataract of the right eye.   POSTOPERATIVE DIAGNOSIS:  Nuclear sclerotic cataract of the right eye.   OPERATIVE PROCEDURE: Procedure(s): CATARACT EXTRACTION PHACO AND INTRAOCULAR LENS PLACEMENT (IOC) RIGHT  CLAREON VIVITY TORIC LENS   SURGEON:  Galen Manila, MD.   ANESTHESIA: 1.      Managed anesthesia care. 2.     0.44ml of Shugarcaine was instilled following the paracentesis  Anesthesiologist: Marisue Humble, MD CRNA: Irving Burton, CRNA; Andee Poles, CRNA  COMPLICATIONS:  None.   TECHNIQUE:   Stop and chop    DESCRIPTION OF PROCEDURE:  The patient was examined and consented in the preoperative holding area where the aforementioned topical anesthesia was applied to the right eye.  The patient was brought back to the Operating Room where he was sat upright on the gurney and given a target to fixate upon while the eye was marked at the 3:00 and 9:00 position.  The patient was then reclined on the operating table.  The eye was prepped and draped in the usual sterile ophthalmic fashion and a lid speculum was placed. A paracentesis was created with the side port blade and the anterior chamber was filled with viscoelastic. A near clear corneal incision was performed with the steel keratome. A continuous curvilinear capsulorrhexis was performed with a cystotome followed by the capsulorrhexis forceps. Hydrodissection and hydrodelineation were carried out with BSS on a blunt cannula. The lens was removed in a stop and chop technique and the remaining cortical material was removed with the irrigation-aspiration handpiece. The eye was inflated with viscoelastic and the ZCT  lens  was placed in the eye and rotated to within a few degrees of the predetermined orientation.  The remaining viscoelastic was removed from the eye.  The Sinskey hook was used to rotate the toric lens into its final resting place at 157 degrees.  0. The eye was inflated to a  physiologic pressure and found to be watertight. 0.78ml of Vigamox was placed in the anterior chamber.  The eye was dressed with Vigamox. And Combigan. The patient was given protective glasses to wear throughout the day and a shield with which to sleep tonight. The patient was also given drops with which to begin a drop regimen today and will follow-up with me in one day. Implant Name Type Inv. Item Serial No. Manufacturer Lot No. LRB No. Used Action  LENS CLAREON VIVITY TORIC 175 - [id number]  LENS CLAREON VIVITY TORIC 175 [id number] [employer]  Right 1 Implanted   Procedure(s) with comments: CATARACT EXTRACTION PHACO AND INTRAOCULAR LENS PLACEMENT (IOC) RIGHT  CLAREON VIVITY TORIC LENS (Right) - 18.92 1:29.7  Electronically signed: Galen Manila 07/07/2023 11:01 AM

## 2023-07-07 NOTE — Transfer of Care (Signed)
Immediate Anesthesia Transfer of Care Note  Patient: Donna Beasley  Procedure(s) Performed: CATARACT EXTRACTION PHACO AND INTRAOCULAR LENS PLACEMENT (IOC) RIGHT  CLAREON VIVITY TORIC LENS (Right: Eye)  Patient Location: PACU  Anesthesia Type: MAC  Level of Consciousness: awake, alert  and patient cooperative  Airway and Oxygen Therapy: Patient Spontanous Breathing and Patient connected to supplemental oxygen  Post-op Assessment: Post-op Vital signs reviewed, Patient's Cardiovascular Status Stable, Respiratory Function Stable, Patent Airway and No signs of Nausea or vomiting  Post-op Vital Signs: Reviewed and stable  Complications: No notable events documented.

## 2023-07-07 NOTE — Anesthesia Postprocedure Evaluation (Signed)
Anesthesia Post Note  Patient: Ahjanae Huitt  Procedure(s) Performed: CATARACT EXTRACTION PHACO AND INTRAOCULAR LENS PLACEMENT (IOC) RIGHT  CLAREON VIVITY TORIC LENS (Right: Eye)  Patient location during evaluation: PACU Anesthesia Type: MAC Level of consciousness: awake and alert Pain management: pain level controlled Vital Signs Assessment: post-procedure vital signs reviewed and stable Respiratory status: spontaneous breathing, nonlabored ventilation, respiratory function stable and patient connected to nasal cannula oxygen Cardiovascular status: stable and blood pressure returned to baseline Postop Assessment: no apparent nausea or vomiting Anesthetic complications: no   No notable events documented.   Last Vitals:  Vitals:   07/07/23 1103 07/07/23 1107  BP: (!) 115/56 (!) 109/55  Pulse: (!) 59 (!) 58  Resp: 18 17  Temp: 36.6 C 36.6 C  SpO2: 95% 96%    Last Pain:  Vitals:   07/07/23 1107  PainSc: 0-No pain                 Lya Holben C Maclaine Ahola

## 2023-07-07 NOTE — H&P (Signed)
[facility]   Primary Care Physician:  Carlean Jews, PA-C Ophthalmologist: Dr. Maren Reamer  Pre-Procedure History & Physical: HPI:  Donna Beasley is a 72 y.o. female here for cataract surgery.   Past Medical History:  Diagnosis Date   Arthritis    osteo - hands, toes, knees   Atrophic vaginitis    Eczema    GERD (gastroesophageal reflux disease)    occas.   Headache    sinus   Hypertension    Knee pain, right    Motion sickness    boats (sometimes)   Osteoarthritis    Pre-diabetes    Sinusitis     Past Surgical History:  Procedure Laterality Date   BIOPSY N/A 11/19/2020   Procedure: BIOPSY;  Surgeon: Midge Minium, MD;  Location: [facility];  Service: Endoscopy;  Laterality: N/A;   COLONOSCOPY WITH PROPOFOL N/A 07/02/2015   Procedure: COLONOSCOPY WITH PROPOFOL;  Surgeon: Midge Minium, MD;  Location: [facility];  Service: Endoscopy;  Laterality: N/A;   COLONOSCOPY WITH PROPOFOL N/A 11/19/2020   Procedure: COLONOSCOPY WITH PROPOFOL;  Surgeon: Midge Minium, MD;  Location: [facility];  Service: Endoscopy;  Laterality: N/A;   POLYPECTOMY  07/02/2015   Procedure: POLYPECTOMY;  Surgeon: Midge Minium, MD;  Location: [facility];  Service: Endoscopy;;   POLYPECTOMY N/A 11/19/2020   Procedure: POLYPECTOMY;  Surgeon: Midge Minium, MD;  Location: [facility];  Service: Endoscopy;  Laterality: N/A;   UTERINE FIBROID SURGERY      Prior to Admission medications   Medication Sig Start Date End Date Taking? Authorizing Provider  carvedilol (COREG) 25 MG tablet TAKE 1 TABLET BY MOUTH TWICE A DAY 05/12/23  Yes McDonough, Lauren K, PA-C  lisinopril-hydrochlorothiazide (ZESTORETIC) 20-12.5 MG tablet TAKE 1 TABLET BY MOUTH EVERY DAY 05/12/23  Yes McDonough, Lauren K, PA-C  Multiple Vitamin (MULTIVITAMIN) tablet Take 1 tablet by mouth daily.   Yes [provider]  Multiple Vitamins-Minerals (PRESERVISION AREDS PO) Take by mouth in the  morning and at bedtime.   Yes [provider]  hydrocortisone cream 0.5 % Apply 1 application topically 2 (two) times daily as needed for itching. Patient not taking: Reported on 06/30/2023    [provider]  triamcinolone cream (KENALOG) 0.1 % Apply 1 application topically 2 (two) times daily as needed. Patient not taking: Reported on 06/30/2023    [provider]    Allergies as of 06/17/2023   (No Known Allergies)    Family History  Problem Relation Age of Onset   Hypertension Mother    Thyroid disease Mother    Hypertension Father    Breast cancer Neg Hx     Social History   Socioeconomic History   Marital status: Single    Spouse name: Not on file   Number of children: Not on file   Years of education: Not on file   Highest education level: Not on file  Occupational History   Not on file  Tobacco Use   Smoking status: Former    Current packs/day: 0.00    Average packs/day: 0.5 packs/day for 40.6 years (20.3 ttl pk-yrs)    Types: Cigarettes    Start date: 57    Quit date: 04/23/2023    Years since quitting: 0.2   Smokeless tobacco: Never   Tobacco comments:    Smokes intermittently  Vaping Use   Vaping status: Never Used  Substance and Sexual Activity   Alcohol use: Not Currently  Drug use: Never   Sexual activity: Not on file  Other Topics Concern   Not on file  Social History Narrative   Not on file   Social Determinants of Health   Financial Resource Strain: Low Risk  (11/03/2022)   Overall Financial Resource Strain (CARDIA)    Difficulty of Paying Living Expenses: Not very hard  Food Insecurity: No Food Insecurity (11/03/2022)   Hunger Vital Sign    Worried About Running Out of Food in the Last Year: Never true    Ran Out of Food in the Last Year: Never true  Transportation Needs: No Transportation Needs (11/03/2022)   PRAPARE - Administrator, Civil Service (Medical): No    Lack of Transportation  (Non-Medical): No  Physical Activity: Not on file  Stress: No Stress Concern Present (11/03/2022)   [employer] of Occupational Health - Occupational Stress Questionnaire    Feeling of Stress : Not at all  Social Connections: Not on file  Intimate Partner Violence: Not At Risk (11/03/2022)   Humiliation, Afraid, Rape, and Kick questionnaire    Fear of Current or Ex-Partner: No    Emotionally Abused: No    Physically Abused: No    Sexually Abused: No    Review of Systems: See HPI, otherwise negative ROS  Physical Exam: BP (!) 142/63   Pulse (!) 57   Temp 97.9 F (36.6 C)   Resp (!) 22   Ht 5\' 4"  (1.626 m)   Wt 88.9 kg   SpO2 97%   BMI 33.64 kg/m  General:   Alert, cooperative in NAD Head:  Normocephalic and atraumatic. Respiratory:  Normal work of breathing. Cardiovascular:  RRR  Impression/Plan: Donna Beasley is here for cataract surgery.  Risks, benefits, limitations, and alternatives regarding cataract surgery have been reviewed with the patient.  Questions have been answered.  All parties agreeable.   Galen Manila, MD  07/07/2023, 10:30 AM

## 2023-07-07 NOTE — Anesthesia Preprocedure Evaluation (Addendum)
Anesthesia Evaluation  Patient identified by MRN, date of birth, ID band Patient awake    Reviewed: Allergy & Precautions, H&P , NPO status , Patient's Chart, lab work & pertinent test results  Airway Mallampati: III  TM Distance: >3 FB Neck ROM: Full    Dental no notable dental hx.    Pulmonary neg pulmonary ROS   Pulmonary exam normal breath sounds clear to auscultation       Cardiovascular hypertension, negative cardio ROS Normal cardiovascular exam Rhythm:Regular Rate:Normal     Neuro/Psych  Headaches negative neurological ROS  negative psych ROS   GI/Hepatic negative GI ROS, Neg liver ROS,GERD  ,,  Endo/Other  negative endocrine ROSdiabetes    Renal/GU negative Renal ROS  negative genitourinary   Musculoskeletal negative musculoskeletal ROS (+) Arthritis ,    Abdominal   Peds negative pediatric ROS (+)  Hematology negative hematology ROS (+)   Anesthesia Other Findings   Reproductive/Obstetrics negative OB ROS                             Anesthesia Physical Anesthesia Plan  ASA: 2  Anesthesia Plan: MAC   Post-op Pain Management:    Induction: Intravenous  PONV Risk Score and Plan:   Airway Management Planned: Natural Airway and Nasal Cannula  Additional Equipment:   Intra-op Plan:   Post-operative Plan:   Informed Consent: I have reviewed the patients History and Physical, chart, labs and discussed the procedure including the risks, benefits and alternatives for the proposed anesthesia with the patient or authorized representative who has indicated his/her understanding and acceptance.     Dental Advisory Given  Plan Discussed with: Anesthesiologist, CRNA and Surgeon  Anesthesia Plan Comments: (Patient consented for risks of anesthesia including but not limited to:  - adverse reactions to medications - damage to eyes, teeth, lips or other oral mucosa - nerve  damage due to positioning  - sore throat or hoarseness - Damage to heart, brain, nerves, lungs, other parts of body or loss of life  Patient voiced understanding and assent.)        Anesthesia Quick Evaluation

## 2023-07-08 ENCOUNTER — Encounter: Payer: Self-pay | Admitting: Ophthalmology

## 2023-07-08 DIAGNOSIS — H2512 Age-related nuclear cataract, left eye: Secondary | ICD-10-CM | POA: Diagnosis not present

## 2023-07-08 NOTE — Anesthesia Preprocedure Evaluation (Addendum)
Anesthesia Evaluation  Patient identified by MRN, date of birth, ID band Patient awake    Reviewed: Allergy & Precautions, H&P , NPO status , Patient's Chart, lab work & pertinent test results  Airway Mallampati: III  TM Distance: >3 FB Neck ROM: Full    Dental no notable dental hx.    Pulmonary neg pulmonary ROS, former smoker   Pulmonary exam normal breath sounds clear to auscultation       Cardiovascular hypertension, Normal cardiovascular exam Rhythm:Regular Rate:Normal     Neuro/Psych  Headaches negative neurological ROS  negative psych ROS   GI/Hepatic Neg liver ROS,GERD  ,,  Endo/Other  diabetes    Renal/GU negative Renal ROS  negative genitourinary   Musculoskeletal  (+) Arthritis ,    Abdominal   Peds negative pediatric ROS (+)  Hematology negative hematology ROS (+)   Anesthesia Other Findings Previous cataract surgery 07-07-23   GERD (gastroesophageal reflux disease) Headache Eczema Knee pain, right Hypertension Motion sickness Sinusitis Atrophic vaginitis Arthritis Osteoarthritis Pre-diabetes     Reproductive/Obstetrics negative OB ROS                             Anesthesia Physical Anesthesia Plan  ASA: 3  Anesthesia Plan: MAC   Post-op Pain Management:    Induction: Intravenous  PONV Risk Score and Plan:   Airway Management Planned: Natural Airway and Nasal Cannula  Additional Equipment:   Intra-op Plan:   Post-operative Plan:   Informed Consent: I have reviewed the patients History and Physical, chart, labs and discussed the procedure including the risks, benefits and alternatives for the proposed anesthesia with the patient or authorized representative who has indicated his/her understanding and acceptance.     Dental Advisory Given  Plan Discussed with: Anesthesiologist, CRNA and Surgeon  Anesthesia Plan Comments: (Patient consented for risks  of anesthesia including but not limited to:  - adverse reactions to medications - damage to eyes, teeth, lips or other oral mucosa - nerve damage due to positioning  - sore throat or hoarseness - Damage to heart, brain, nerves, lungs, other parts of body or loss of life  Patient voiced understanding and assent.)        Anesthesia Quick Evaluation

## 2023-07-20 NOTE — Discharge Instructions (Signed)

## 2023-07-21 ENCOUNTER — Encounter
Admission: RE | Disposition: A | Discharge status: Home / Self Care | Payer: Self-pay | Source: Home / Self Care | Attending: Ophthalmology

## 2023-07-21 ENCOUNTER — Ambulatory Visit: Payer: Medicare PPO | Admitting: Anesthesiology

## 2023-07-21 ENCOUNTER — Ambulatory Visit
Admission: RE | Admit: 2023-07-21 | Discharge: 2023-07-21 | Disposition: A | Discharge status: Home / Self Care | Payer: Medicare PPO | Attending: Ophthalmology | Admitting: Ophthalmology

## 2023-07-21 ENCOUNTER — Other Ambulatory Visit: Payer: Self-pay

## 2023-07-21 ENCOUNTER — Encounter: Payer: Self-pay | Admitting: Ophthalmology

## 2023-07-21 DIAGNOSIS — I10 Essential (primary) hypertension: Secondary | ICD-10-CM | POA: Diagnosis not present

## 2023-07-21 DIAGNOSIS — K219 Gastro-esophageal reflux disease without esophagitis: Secondary | ICD-10-CM | POA: Insufficient documentation

## 2023-07-21 DIAGNOSIS — E1136 Type 2 diabetes mellitus with diabetic cataract: Secondary | ICD-10-CM | POA: Insufficient documentation

## 2023-07-21 DIAGNOSIS — H2512 Age-related nuclear cataract, left eye: Secondary | ICD-10-CM | POA: Insufficient documentation

## 2023-07-21 DIAGNOSIS — M1990 Unspecified osteoarthritis, unspecified site: Secondary | ICD-10-CM | POA: Diagnosis not present

## 2023-07-21 DIAGNOSIS — Z87891 Personal history of nicotine dependence: Secondary | ICD-10-CM | POA: Insufficient documentation

## 2023-07-21 HISTORY — PX: CATARACT EXTRACTION W/PHACO: SHX586

## 2023-07-21 SURGERY — PHACOEMULSIFICATION, CATARACT, WITH IOL INSERTION
Anesthesia: Monitor Anesthesia Care | Laterality: Left

## 2023-07-21 MED ORDER — MIDAZOLAM HCL 2 MG/2ML IJ SOLN
INTRAMUSCULAR | Status: AC
  Filled 2023-07-21: qty 2

## 2023-07-21 MED ORDER — SIGHTPATH DOSE#1 BSS IO SOLN
INTRAOCULAR | Status: DC | PRN
  Administered 2023-07-21: 15 mL via INTRAOCULAR

## 2023-07-21 MED ORDER — FENTANYL CITRATE (PF) 100 MCG/2ML IJ SOLN
INTRAMUSCULAR | Status: DC | PRN
  Administered 2023-07-21: 50 ug via INTRAVENOUS

## 2023-07-21 MED ORDER — ARMC OPHTHALMIC DILATING DROPS
OPHTHALMIC | Status: AC
  Filled 2023-07-21: qty 0.5

## 2023-07-21 MED ORDER — SIGHTPATH DOSE#1 NA CHONDROIT SULF-NA HYALURON 40-17 MG/ML IO SOLN
INTRAOCULAR | Status: DC | PRN
  Administered 2023-07-21: 1 mL via INTRAOCULAR

## 2023-07-21 MED ORDER — ONDANSETRON HCL 4 MG/2ML IJ SOLN
INTRAMUSCULAR | Status: DC | PRN
  Administered 2023-07-21: 4 mg via INTRAVENOUS

## 2023-07-21 MED ORDER — TETRACAINE HCL 0.5 % OP SOLN
OPHTHALMIC | Status: AC
  Filled 2023-07-21: qty 4

## 2023-07-21 MED ORDER — MIDAZOLAM HCL 2 MG/2ML IJ SOLN
INTRAMUSCULAR | Status: DC | PRN
  Administered 2023-07-21: 2 mg via INTRAVENOUS

## 2023-07-21 MED ORDER — MOXIFLOXACIN HCL 0.5 % OP SOLN
OPHTHALMIC | Status: DC | PRN
  Administered 2023-07-21: .2 mL via OPHTHALMIC

## 2023-07-21 MED ORDER — FENTANYL CITRATE (PF) 100 MCG/2ML IJ SOLN
INTRAMUSCULAR | Status: AC
  Filled 2023-07-21: qty 2

## 2023-07-21 MED ORDER — ONDANSETRON HCL 4 MG/2ML IJ SOLN
INTRAMUSCULAR | Status: AC
  Filled 2023-07-21: qty 2

## 2023-07-21 MED ORDER — SIGHTPATH DOSE#1 BSS IO SOLN
INTRAOCULAR | Status: DC | PRN
  Administered 2023-07-21: 2 mL

## 2023-07-21 MED ORDER — BRIMONIDINE TARTRATE-TIMOLOL 0.2-0.5 % OP SOLN
OPHTHALMIC | Status: DC | PRN
  Administered 2023-07-21: 1 [drp] via OPHTHALMIC

## 2023-07-21 MED ORDER — TETRACAINE HCL 0.5 % OP SOLN
1.0000 [drp] | OPHTHALMIC | Status: DC | PRN
  Administered 2023-07-21 (×3): 1 [drp] via OPHTHALMIC

## 2023-07-21 MED ORDER — SIGHTPATH DOSE#1 BSS IO SOLN
INTRAOCULAR | Status: DC | PRN
  Administered 2023-07-21: 55 mL via OPHTHALMIC

## 2023-07-21 MED ORDER — SODIUM CHLORIDE 0.9% FLUSH: 10.0000 mL | INTRAVENOUS | Status: DC | PRN

## 2023-07-21 MED ORDER — ARMC OPHTHALMIC DILATING DROPS
1.0000 | OPHTHALMIC | Status: DC | PRN
  Administered 2023-07-21 (×3): 1 via OPHTHALMIC

## 2023-07-21 SURGICAL SUPPLY — 11 items
ANGLE REVERSE CUT SHRT 25GA (CUTTER) ×1
CATARACT SUITE SIGHTPATH (MISCELLANEOUS) ×1
CYSTOTOME ANGL RVRS SHRT 25G (CUTTER) ×1 IMPLANT
FEE CATARACT SUITE SIGHTPATH (MISCELLANEOUS) ×1 IMPLANT
GLOVE BIOGEL PI IND STRL 8 (GLOVE) ×1 IMPLANT
GLOVE SURG LX STRL 8.0 MICRO (GLOVE) ×1 IMPLANT
LENS CLAREON VIVITY TORIC 175 ×1 IMPLANT
LENS IOL CLRN VT TRC 3 17.5 IMPLANT
NDL FILTER BLUNT 18X1 1/2 (NEEDLE) ×1 IMPLANT
NEEDLE FILTER BLUNT 18X1 1/2 (NEEDLE) ×1
SYR 3ML LL SCALE MARK (SYRINGE) ×1 IMPLANT

## 2023-07-21 NOTE — Op Note (Signed)
PREOPERATIVE DIAGNOSIS:  Nuclear sclerotic cataract of the left eye.   POSTOPERATIVE DIAGNOSIS:  Nuclear sclerotic cataract of the left eye.   OPERATIVE PROCEDURE: Procedure(s): CATARACT EXTRACTION PHACO AND INTRAOCULAR LENS PLACEMENT (IOC) LEFT  CLAREON VIVITY TORIC LENS 11.12 1:01.7   SURGEON:  Galen Manila, MD.   ANESTHESIA: 1.      Managed anesthesia care. 2.     0.70ml os Shugarcaine was instilled following the paracentesis [email or web address]   COMPLICATIONS:  None.   TECHNIQUE:   Stop and chop    DESCRIPTION OF PROCEDURE:  The patient was examined and consented in the preoperative holding area where the aforementioned topical anesthesia was applied to the left eye.  The patient was brought back to the Operating Room where he was sat upright on the gurney and given a target to fixate upon while the eye was marked at the 3:00 and 9:00 position.  The patient was then reclined on the operating table.  The eye was prepped and draped in the usual sterile ophthalmic fashion and a lid speculum was placed. A paracentesis was created with the side port blade and the anterior chamber was filled with viscoelastic. A near clear corneal incision was performed with the steel keratome. A continuous curvilinear capsulorrhexis was performed with a cystotome followed by the capsulorrhexis forceps. Hydrodissection and hydrodelineation were carried out with BSS on a blunt cannula. The lens was removed in a stop and chop technique and the remaining cortical material was removed with the irrigation-aspiration handpiece. The eye was inflated with viscoelastic and the ZCT lens was placed in the eye and rotated to within a few degrees of the predetermined orientation.  The remaining viscoelastic was removed from the eye.  The Sinskey hook was used to rotate the toric lens into its final resting place at 009 degrees.  0.1 ml of Vigamox was placed in the anterior chamber. The eye was inflated to a physiologic pressure and  found to be watertight.  The eye was dressed with Vigamox. The patient was given protective glasses to wear throughout the day and a shield with which to sleep tonight. The patient was also given drops with which to begin a drop regimen today and will follow-up with me in one day. Implant Name Type Inv. Item Serial No. Manufacturer Lot No. LRB No. Used Action  LENS CLAREON VIVITY TORIC 175 - [id number]  LENS CLAREON VIVITY TORIC 175 [id number] [employer]  Left 1 Implanted   Procedure(s): CATARACT EXTRACTION PHACO AND INTRAOCULAR LENS PLACEMENT (IOC) LEFT  CLAREON VIVITY TORIC LENS 11.12 1:01.7 (Left)  Electronically signed: Galen Manila 11/12/202411:38 AM

## 2023-07-21 NOTE — Transfer of Care (Signed)
Immediate Anesthesia Transfer of Care Note  Patient: Donna Beasley  Procedure(s) Performed: CATARACT EXTRACTION PHACO AND INTRAOCULAR LENS PLACEMENT (IOC) LEFT  CLAREON VIVITY TORIC LENS 11.12 1:01.7 (Left)  Patient Location: PACU  Anesthesia Type: MAC  Level of Consciousness: awake, alert  and patient cooperative  Airway and Oxygen Therapy: Patient Spontanous Breathing and Patient connected to supplemental oxygen  Post-op Assessment: Post-op Vital signs reviewed, Patient's Cardiovascular Status Stable, Respiratory Function Stable, Patent Airway and No signs of Nausea or vomiting  Post-op Vital Signs: Reviewed and stable  Complications: No notable events documented.

## 2023-07-21 NOTE — H&P (Signed)
[facility]   Primary Care Physician:  Carlean Jews, PA-C Ophthalmologist: Dr. Druscilla Brownie  Pre-Procedure History & Physical: HPI:  Donna Beasley is a 72 y.o. female here for cataract surgery.   Past Medical History:  Diagnosis Date   Arthritis    osteo - hands, toes, knees   Atrophic vaginitis    Eczema    GERD (gastroesophageal reflux disease)    occas.   Headache    sinus   Hypertension    Knee pain, right    Motion sickness    boats (sometimes)   Osteoarthritis    Pre-diabetes    Sinusitis     Past Surgical History:  Procedure Laterality Date   BIOPSY N/A 11/19/2020   Procedure: BIOPSY;  Surgeon: Midge Minium, MD;  Location: [facility];  Service: Endoscopy;  Laterality: N/A;   CATARACT EXTRACTION W/PHACO Right 07/07/2023   Procedure: CATARACT EXTRACTION PHACO AND INTRAOCULAR LENS PLACEMENT (IOC) RIGHT  CLAREON VIVITY TORIC LENS;  Surgeon: Galen Manila, MD;  Location: [facility];  Service: Ophthalmology;  Laterality: Right;  18.92 1:29.7   COLONOSCOPY WITH PROPOFOL N/A 07/02/2015   Procedure: COLONOSCOPY WITH PROPOFOL;  Surgeon: Midge Minium, MD;  Location: [facility];  Service: Endoscopy;  Laterality: N/A;   COLONOSCOPY WITH PROPOFOL N/A 11/19/2020   Procedure: COLONOSCOPY WITH PROPOFOL;  Surgeon: Midge Minium, MD;  Location: [facility];  Service: Endoscopy;  Laterality: N/A;   POLYPECTOMY  07/02/2015   Procedure: POLYPECTOMY;  Surgeon: Midge Minium, MD;  Location: [facility];  Service: Endoscopy;;   POLYPECTOMY N/A 11/19/2020   Procedure: POLYPECTOMY;  Surgeon: Midge Minium, MD;  Location: [facility];  Service: Endoscopy;  Laterality: N/A;   UTERINE FIBROID SURGERY      Prior to Admission medications   Medication Sig Start Date End Date Taking? Authorizing Provider  carvedilol (COREG) 25 MG tablet TAKE 1 TABLET BY MOUTH TWICE A DAY 05/12/23  Yes McDonough, Lauren K, PA-C   lisinopril-hydrochlorothiazide (ZESTORETIC) 20-12.5 MG tablet TAKE 1 TABLET BY MOUTH EVERY DAY 05/12/23  Yes McDonough, Lauren K, PA-C  Multiple Vitamin (MULTIVITAMIN) tablet Take 1 tablet by mouth daily.   Yes [provider]  Multiple Vitamins-Minerals (PRESERVISION AREDS PO) Take by mouth in the morning and at bedtime.   Yes [provider]  hydrocortisone cream 0.5 % Apply 1 application topically 2 (two) times daily as needed for itching. Patient not taking: Reported on 06/30/2023    [provider]  triamcinolone cream (KENALOG) 0.1 % Apply 1 application topically 2 (two) times daily as needed. Patient not taking: Reported on 06/30/2023    [provider]    Allergies as of 06/17/2023   (No Known Allergies)    Family History  Problem Relation Age of Onset   Hypertension Mother    Thyroid disease Mother    Hypertension Father    Breast cancer Neg Hx     Social History   Socioeconomic History   Marital status: Single    Spouse name: Not on file   Number of children: Not on file   Years of education: Not on file   Highest education level: Not on file  Occupational History   Not on file  Tobacco Use   Smoking status: Former    Current packs/day: 0.00    Average packs/day: 0.5 packs/day for 40.6 years (20.3 ttl pk-yrs)    Types: Cigarettes    Start date: 48    Quit date: 04/23/2023  Years since quitting: 0.2   Smokeless tobacco: Never   Tobacco comments:    Smokes intermittently  Vaping Use   Vaping status: Never Used  Substance and Sexual Activity   Alcohol use: Not Currently   Drug use: Never   Sexual activity: Not on file  Other Topics Concern   Not on file  Social History Narrative   Not on file   Social Determinants of Health   Financial Resource Strain: Low Risk  (11/03/2022)   Overall Financial Resource Strain (CARDIA)    Difficulty of Paying Living Expenses: Not very hard  Food Insecurity: No Food Insecurity  (11/03/2022)   Hunger Vital Sign    Worried About Running Out of Food in the Last Year: Never true    Ran Out of Food in the Last Year: Never true  Transportation Needs: No Transportation Needs (11/03/2022)   PRAPARE - Administrator, Civil Service (Medical): No    Lack of Transportation (Non-Medical): No  Physical Activity: Not on file  Stress: No Stress Concern Present (11/03/2022)   [employer] of Occupational Health - Occupational Stress Questionnaire    Feeling of Stress : Not at all  Social Connections: Not on file  Intimate Partner Violence: Not At Risk (11/03/2022)   Humiliation, Afraid, Rape, and Kick questionnaire    Fear of Current or Ex-Partner: No    Emotionally Abused: No    Physically Abused: No    Sexually Abused: No    Review of Systems: See HPI, otherwise negative ROS  Physical Exam: BP (!) 158/67   Pulse (!) 58   Temp 97.7 F (36.5 C) (Temporal)   Resp 14   Ht 5\' 4"  (1.626 m)   Wt 90.3 kg   SpO2 96%   BMI 34.16 kg/m  General:   Alert, cooperative in NAD Head:  Normocephalic and atraumatic. Respiratory:  Normal work of breathing. Cardiovascular:  RRR  Impression/Plan: Donna Beasley is here for cataract surgery.  Risks, benefits, limitations, and alternatives regarding cataract surgery have been reviewed with the patient.  Questions have been answered.  All parties agreeable.   Galen Manila, MD  07/21/2023, 11:05 AM

## 2023-07-21 NOTE — Anesthesia Postprocedure Evaluation (Signed)
Anesthesia Post Note  Patient: Donna Beasley  Procedure(s) Performed: CATARACT EXTRACTION PHACO AND INTRAOCULAR LENS PLACEMENT (IOC) LEFT  CLAREON VIVITY TORIC LENS 11.12 1:01.7 (Left)  Patient location during evaluation: PACU Anesthesia Type: MAC Level of consciousness: awake and alert Pain management: pain level controlled Vital Signs Assessment: post-procedure vital signs reviewed and stable Respiratory status: spontaneous breathing, nonlabored ventilation, respiratory function stable and patient connected to nasal cannula oxygen Cardiovascular status: stable and blood pressure returned to baseline Postop Assessment: no apparent nausea or vomiting Anesthetic complications: no   No notable events documented.   Last Vitals:  Vitals:   07/21/23 1141 07/21/23 1146  BP: (!) 105/54 117/68  Pulse: (!) 53 (!) 56  Resp: 20 13  Temp: 36.4 C 36.6 C  SpO2: 95% 95%    Last Pain:  Vitals:   07/21/23 1141  TempSrc:   PainSc: 0-No pain                 Donna Beasley

## 2023-07-23 ENCOUNTER — Encounter: Payer: Self-pay | Admitting: Ophthalmology

## 2023-08-07 ENCOUNTER — Other Ambulatory Visit: Payer: Self-pay | Admitting: Physician Assistant

## 2023-09-25 DIAGNOSIS — Z0001 Encounter for general adult medical examination with abnormal findings: Secondary | ICD-10-CM | POA: Diagnosis not present

## 2023-09-25 DIAGNOSIS — R7303 Prediabetes: Secondary | ICD-10-CM | POA: Diagnosis not present

## 2023-09-25 DIAGNOSIS — R5383 Other fatigue: Secondary | ICD-10-CM | POA: Diagnosis not present

## 2023-09-25 DIAGNOSIS — E782 Mixed hyperlipidemia: Secondary | ICD-10-CM | POA: Diagnosis not present

## 2023-09-25 DIAGNOSIS — R946 Abnormal results of thyroid function studies: Secondary | ICD-10-CM | POA: Diagnosis not present

## 2023-09-26 LAB — COMPREHENSIVE METABOLIC PANEL
ALT: 19 [IU]/L (ref 0–32)
AST: 24 [IU]/L (ref 0–40)
Albumin: 4 g/dL (ref 3.8–4.8)
Alkaline Phosphatase: 84 [IU]/L (ref 44–121)
BUN/Creatinine Ratio: 14 (ref 12–28)
BUN: 14 mg/dL (ref 8–27)
Bilirubin Total: 0.3 mg/dL (ref 0.0–1.2)
CO2: 23 mmol/L (ref 20–29)
Calcium: 9.4 mg/dL (ref 8.7–10.3)
Chloride: 100 mmol/L (ref 96–106)
Creatinine, Ser: 1 mg/dL (ref 0.57–1.00)
Globulin, Total: 3.1 g/dL (ref 1.5–4.5)
Glucose: 136 mg/dL — ABNORMAL HIGH (ref 70–99)
Potassium: 4.9 mmol/L (ref 3.5–5.2)
Sodium: 137 mmol/L (ref 134–144)
Total Protein: 7.1 g/dL (ref 6.0–8.5)
eGFR: 59 mL/min/{1.73_m2} — ABNORMAL LOW (ref >59)

## 2023-09-26 LAB — CBC WITH DIFFERENTIAL/PLATELET
Basophils Absolute: 0.1 10*3/uL (ref 0.0–0.2)
Basos: 1 %
EOS (ABSOLUTE): 0.3 10*3/uL (ref 0.0–0.4)
Eos: 3 %
Hematocrit: 39.5 % (ref 34.0–46.6)
Hemoglobin: 13 g/dL (ref 11.1–15.9)
Immature Grans (Abs): 0 10*3/uL (ref 0.0–0.1)
Immature Granulocytes: 0 %
Lymphocytes Absolute: 2.6 10*3/uL (ref 0.7–3.1)
Lymphs: 23 %
MCH: 30 pg (ref 26.6–33.0)
MCHC: 32.9 g/dL (ref 31.5–35.7)
MCV: 91 fL (ref 79–97)
Monocytes Absolute: 0.9 10*3/uL (ref 0.1–0.9)
Monocytes: 8 %
Neutrophils Absolute: 7.6 10*3/uL — ABNORMAL HIGH (ref 1.4–7.0)
Neutrophils: 65 %
Platelets: 341 10*3/uL (ref 150–450)
RBC: 4.34 x10E6/uL (ref 3.77–5.28)
RDW: 13 % (ref 11.7–15.4)
WBC: 11.6 10*3/uL — ABNORMAL HIGH (ref 3.4–10.8)

## 2023-09-26 LAB — LIPID PANEL WITH LDL/HDL RATIO
Cholesterol, Total: 197 mg/dL (ref 100–199)
HDL: 43 mg/dL (ref >39)
LDL Chol Calc (NIH): 127 mg/dL — ABNORMAL HIGH (ref 0–99)
LDL/HDL Ratio: 3 {ratio} (ref 0.0–3.2)
Triglycerides: 152 mg/dL — ABNORMAL HIGH (ref 0–149)
VLDL Cholesterol Cal: 27 mg/dL (ref 5–40)

## 2023-09-26 LAB — HGB A1C W/O EAG: Hgb A1c MFr Bld: 6.3 % — ABNORMAL HIGH (ref 4.8–5.6)

## 2023-09-26 LAB — TSH+FREE T4
Free T4: 1.15 ng/dL (ref 0.82–1.77)
TSH: 3.41 u[IU]/mL (ref 0.450–4.500)

## 2023-10-05 ENCOUNTER — Encounter: Payer: Self-pay | Admitting: Physician Assistant

## 2023-10-05 ENCOUNTER — Ambulatory Visit: Payer: Medicare PPO | Admitting: Physician Assistant

## 2023-10-05 VITALS — BP 115/72 | HR 56 | Temp 98.3°F | Resp 16 | Ht 63.0 in | Wt 208.8 lb

## 2023-10-05 DIAGNOSIS — E782 Mixed hyperlipidemia: Secondary | ICD-10-CM

## 2023-10-05 DIAGNOSIS — I10 Essential (primary) hypertension: Secondary | ICD-10-CM | POA: Diagnosis not present

## 2023-10-05 DIAGNOSIS — E1159 Type 2 diabetes mellitus with other circulatory complications: Secondary | ICD-10-CM

## 2023-10-05 NOTE — Progress Notes (Signed)
[facility] [address] [place], [place] [address]  Internal MEDICINE  Office Visit Note  Patient Name: Donna Beasley  [id number]  [id number]  Date of Service: 10/05/2023  Chief Complaint  Patient presents with   Follow-up   Gastroesophageal Reflux   Hypertension   Quality Metric Gaps    Shingles Vaccine    HPI Pt is here for routine follow up -Quit smoking in August 2024!, but did gain some wt after this and is making some changes to help this --Labs reviewed: A1c up some, elev cholesterol, reports not eating as well since quitting smoking and will work on this. Also normally does pool exercises, but it closed and hasn't been able to do her pool walking. WBC continue to be elevated. Hematology did full work up last year and states elev WBC likely chronic inflammation. GFR slightly reduced and will monitor -Hr a little low in office and BP very well controlled. Will drop to 1/2 carvedilol BID--will cut in half for now and may send new script in future for 12.5 dose if working well. Denies feeling dizzy. -Will try night brace for right carpal tunnel and will see ortho if not improving. Also having knee pain and is interested in seeing ortho for possible injections. Plans to go to [facility] and will schedule this when ready  Current Medication: Outpatient Encounter Medications as of 10/05/2023  Medication Sig   carvedilol (COREG) 25 MG tablet TAKE 1 TABLET BY MOUTH TWICE A DAY   hydrocortisone cream 0.5 % Apply 1 application  topically 2 (two) times daily as needed for itching.   lisinopril-hydrochlorothiazide (ZESTORETIC) 20-12.5 MG tablet TAKE 1 TABLET BY MOUTH EVERY DAY   Multiple Vitamin (MULTIVITAMIN) tablet Take 1 tablet by mouth daily.   Multiple Vitamins-Minerals (PRESERVISION AREDS PO) Take by mouth in the morning and at bedtime.   triamcinolone cream (KENALOG) 0.1 % Apply 1 application  topically 2 (two) times daily as needed.   No facility-administered  encounter medications on file as of 10/05/2023.    Surgical History: Past Surgical History:  Procedure Laterality Date   BIOPSY N/A 11/19/2020   Procedure: BIOPSY;  Surgeon: Midge Minium, MD;  Location: [facility];  Service: Endoscopy;  Laterality: N/A;   CATARACT EXTRACTION W/PHACO Right 07/07/2023   Procedure: CATARACT EXTRACTION PHACO AND INTRAOCULAR LENS PLACEMENT (IOC) RIGHT  CLAREON VIVITY TORIC LENS;  Surgeon: Galen Manila, MD;  Location: [facility];  Service: Ophthalmology;  Laterality: Right;  18.92 1:29.7   CATARACT EXTRACTION W/PHACO Left 07/21/2023   Procedure: CATARACT EXTRACTION PHACO AND INTRAOCULAR LENS PLACEMENT (IOC) LEFT  CLAREON VIVITY TORIC LENS 11.12 1:01.7;  Surgeon: Galen Manila, MD;  Location: [facility];  Service: Ophthalmology;  Laterality: Left;   COLONOSCOPY WITH PROPOFOL N/A 07/02/2015   Procedure: COLONOSCOPY WITH PROPOFOL;  Surgeon: Midge Minium, MD;  Location: [facility];  Service: Endoscopy;  Laterality: N/A;   COLONOSCOPY WITH PROPOFOL N/A 11/19/2020   Procedure: COLONOSCOPY WITH PROPOFOL;  Surgeon: Midge Minium, MD;  Location: [facility];  Service: Endoscopy;  Laterality: N/A;   POLYPECTOMY  07/02/2015   Procedure: POLYPECTOMY;  Surgeon: Midge Minium, MD;  Location: [facility];  Service: Endoscopy;;   POLYPECTOMY N/A 11/19/2020   Procedure: POLYPECTOMY;  Surgeon: Midge Minium, MD;  Location: [facility];  Service: Endoscopy;  Laterality: N/A;   UTERINE FIBROID SURGERY      Medical History: Past Medical History:  Diagnosis Date   Arthritis    osteo -  hands, toes, knees   Atrophic vaginitis    Eczema    GERD (gastroesophageal reflux disease)    occas.   Headache    sinus   Hypertension    Knee pain, right    Motion sickness    boats (sometimes)   Osteoarthritis    Pre-diabetes    Sinusitis     Family History: Family History  Problem Relation Age of Onset   Hypertension  Mother    Thyroid disease Mother    Hypertension Father    Breast cancer Neg Hx     Social History   Socioeconomic History   Marital status: Single    Spouse name: Not on file   Number of children: Not on file   Years of education: Not on file   Highest education level: Not on file  Occupational History   Not on file  Tobacco Use   Smoking status: Former    Current packs/day: 0.00    Average packs/day: 0.5 packs/day for 40.6 years (20.3 ttl pk-yrs)    Types: Cigarettes    Start date: 55    Quit date: 04/23/2023    Years since quitting: 0.4   Smokeless tobacco: Never   Tobacco comments:    Quit April 23, 2023.  Vaping Use   Vaping status: Never Used  Substance and Sexual Activity   Alcohol use: Not Currently   Drug use: Never   Sexual activity: Not on file  Other Topics Concern   Not on file  Social History Narrative   Not on file   Social Drivers of Health   Financial Resource Strain: Low Risk  (11/03/2022)   Overall Financial Resource Strain (CARDIA)    Difficulty of Paying Living Expenses: Not very hard  Food Insecurity: No Food Insecurity (11/03/2022)   Hunger Vital Sign    Worried About Running Out of Food in the Last Year: Never true    Ran Out of Food in the Last Year: Never true  Transportation Needs: No Transportation Needs (11/03/2022)   PRAPARE - Administrator, Civil Service (Medical): No    Lack of Transportation (Non-Medical): No  Physical Activity: Not on file  Stress: No Stress Concern Present (11/03/2022)   [employer] of Occupational Health - Occupational Stress Questionnaire    Feeling of Stress : Not at all  Social Connections: Not on file  Intimate Partner Violence: Not At Risk (11/03/2022)   Humiliation, Afraid, Rape, and Kick questionnaire    Fear of Current or Ex-Partner: No    Emotionally Abused: No    Physically Abused: No    Sexually Abused: No      Review of Systems  Constitutional:  Negative for chills,  fatigue and unexpected weight change.  HENT:  Negative for congestion, postnasal drip, rhinorrhea, sneezing and sore throat.   Eyes:  Negative for redness.  Respiratory:  Negative for cough, chest tightness and shortness of breath.   Cardiovascular:  Negative for chest pain and palpitations.  Gastrointestinal:  Negative for abdominal pain, constipation, diarrhea, nausea and vomiting.  Genitourinary:  Negative for dysuria and frequency.  Musculoskeletal:  Positive for arthralgias. Negative for back pain, joint swelling and neck pain.  Skin:  Negative for rash.  Neurological: Negative.  Negative for tremors and numbness.  Hematological:  Negative for adenopathy. Does not bruise/bleed easily.  Psychiatric/Behavioral:  Negative for behavioral problems (Depression), sleep disturbance and suicidal ideas. The patient is not nervous/anxious.     Vital Signs: BP 115/72  Pulse (!) 56   Temp 98.3 F (36.8 C)   Resp 16   Ht 5\' 3"  (1.6 m)   Wt 208 lb 12.8 oz (94.7 kg)   SpO2 98%   BMI 36.99 kg/m    Physical Exam Vitals and nursing note reviewed.  Constitutional:      General: She is not in acute distress.    Appearance: She is well-developed. She is not diaphoretic.  HENT:     Head: Normocephalic and atraumatic.     Mouth/Throat:     Pharynx: No oropharyngeal exudate.  Eyes:     Pupils: Pupils are equal, round, and reactive to light.  Neck:     Thyroid: No thyromegaly.     Vascular: No JVD.     Trachea: No tracheal deviation.  Cardiovascular:     Rate and Rhythm: Normal rate and regular rhythm.     Heart sounds: Normal heart sounds. No murmur heard.    No friction rub. No gallop.  Pulmonary:     Effort: Pulmonary effort is normal. No respiratory distress.     Breath sounds: No wheezing.  Chest:  Breasts:    Right: Normal. No mass.     Left: Normal. No mass.  Abdominal:     General: Bowel sounds are normal.     Palpations: Abdomen is soft.  Musculoskeletal:        General:  Normal range of motion.     Cervical back: Normal range of motion and neck supple.  Lymphadenopathy:     Cervical: No cervical adenopathy.  Skin:    General: Skin is warm and dry.  Neurological:     Mental Status: She is alert and oriented to person, place, and time.     Cranial Nerves: No cranial nerve deficit.  Psychiatric:        Behavior: Behavior normal.        Thought Content: Thought content normal.        Judgment: Judgment normal.        Assessment/Plan: 1. Type 2 diabetes mellitus with other circulatory complications (HCC) (Primary) A1c slightly increased from last check, but overall diet controlled and will work on diet and exercise - Urine Microalbumin w/creat. ratio  2. Essential hypertension Very well controlled and HR a little low, will drop with 1/2 tab carvedilol BID (12.5mg  BID). If working well will call for new script at lower dose. Will cut for now. Advised to monitor  3. Mixed hyperlipidemia Will work on diet and exercise   General Counseling: kateria cutrona understanding of the findings of todays visit and agrees with plan of treatment. I have discussed any further diagnostic evaluation that may be needed or ordered today. We also reviewed her medications today. she has been encouraged to call the office with any questions or concerns that should arise related to todays visit.    Orders Placed This Encounter  Procedures   Urine Microalbumin w/creat. ratio    No orders of the defined types were placed in this encounter.   This patient was seen by Lynn Ito, PA-C in collaboration with Dr. Beverely Risen as a part of collaborative care agreement.   Total time spent:30 Minutes Time spent includes review of chart, medications, test results, and follow up plan with the patient.      Dr Lyndon Code Internal medicine

## 2023-10-06 LAB — MICROALBUMIN / CREATININE URINE RATIO
Creatinine, Urine: 53 mg/dL
Microalb/Creat Ratio: <6 mg/g{creat} (ref 0–29)
Microalbumin, Urine: <3 ug/mL

## 2023-11-02 ENCOUNTER — Ambulatory Visit: Payer: Medicare PPO | Admitting: Physician Assistant

## 2023-11-02 ENCOUNTER — Encounter: Payer: Self-pay | Admitting: Physician Assistant

## 2023-11-02 ENCOUNTER — Other Ambulatory Visit: Payer: Self-pay | Admitting: Internal Medicine

## 2023-11-02 VITALS — BP 130/80 | HR 67 | Temp 98.4°F | Resp 16 | Ht 63.0 in | Wt 213.0 lb

## 2023-11-02 DIAGNOSIS — I10 Essential (primary) hypertension: Secondary | ICD-10-CM

## 2023-11-02 DIAGNOSIS — E669 Obesity, unspecified: Secondary | ICD-10-CM

## 2023-11-02 MED ORDER — CARVEDILOL 25 MG PO TABS: 12.5000 mg | ORAL_TABLET | Freq: Two times a day (BID) | ORAL | Status: DC

## 2023-11-02 NOTE — Progress Notes (Signed)
 [facility] [address] [place], [place] [address]  Internal MEDICINE  Office Visit Note  Patient Name: Donna Beasley  [id number]  [id number]  Date of Service: 11/02/2023  Chief Complaint  Patient presents with   Follow-up   Gastroesophageal Reflux   Hypertension    HPI Pt is here for routine follow up -Switched to 1/2 tab carvedilol BID due to low HR previously.  -BP at home not checked at home, but HR has been in 60s at home now since dropping dose -Still working on adjusting diet to help with wt since quitting smoking. She has previously done well with wt loss and knows what needs to change. Hopeful that pool will open again for water walking, but is not sure if this will happen or not. May try walking on land, but this is more difficult for her  Current Medication: Outpatient Encounter Medications as of 11/02/2023  Medication Sig   hydrocortisone cream 0.5 % Apply 1 application  topically 2 (two) times daily as needed for itching.   lisinopril-hydrochlorothiazide (ZESTORETIC) 20-12.5 MG tablet TAKE 1 TABLET BY MOUTH EVERY DAY   Multiple Vitamin (MULTIVITAMIN) tablet Take 1 tablet by mouth daily.   Multiple Vitamins-Minerals (PRESERVISION AREDS PO) Take by mouth in the morning and at bedtime.   triamcinolone cream (KENALOG) 0.1 % Apply 1 application  topically 2 (two) times daily as needed.   [DISCONTINUED] carvedilol (COREG) 25 MG tablet TAKE 1 TABLET BY MOUTH TWICE A DAY   carvedilol (COREG) 25 MG tablet Take 0.5 tablets (12.5 mg total) by mouth 2 (two) times daily.   No facility-administered encounter medications on file as of 11/02/2023.    Surgical History: Past Surgical History:  Procedure Laterality Date   BIOPSY N/A 11/19/2020   Procedure: BIOPSY;  Surgeon: Midge Minium, MD;  Location: [facility];  Service: Endoscopy;  Laterality: N/A;   CATARACT EXTRACTION W/PHACO Right 07/07/2023   Procedure: CATARACT EXTRACTION PHACO AND INTRAOCULAR LENS  PLACEMENT (IOC) RIGHT  CLAREON VIVITY TORIC LENS;  Surgeon: Galen Manila, MD;  Location: [facility];  Service: Ophthalmology;  Laterality: Right;  18.92 1:29.7   CATARACT EXTRACTION W/PHACO Left 07/21/2023   Procedure: CATARACT EXTRACTION PHACO AND INTRAOCULAR LENS PLACEMENT (IOC) LEFT  CLAREON VIVITY TORIC LENS 11.12 1:01.7;  Surgeon: Galen Manila, MD;  Location: [facility];  Service: Ophthalmology;  Laterality: Left;   COLONOSCOPY WITH PROPOFOL N/A 07/02/2015   Procedure: COLONOSCOPY WITH PROPOFOL;  Surgeon: Midge Minium, MD;  Location: [facility];  Service: Endoscopy;  Laterality: N/A;   COLONOSCOPY WITH PROPOFOL N/A 11/19/2020   Procedure: COLONOSCOPY WITH PROPOFOL;  Surgeon: Midge Minium, MD;  Location: [facility];  Service: Endoscopy;  Laterality: N/A;   POLYPECTOMY  07/02/2015   Procedure: POLYPECTOMY;  Surgeon: Midge Minium, MD;  Location: [facility];  Service: Endoscopy;;   POLYPECTOMY N/A 11/19/2020   Procedure: POLYPECTOMY;  Surgeon: Midge Minium, MD;  Location: [facility];  Service: Endoscopy;  Laterality: N/A;   UTERINE FIBROID SURGERY      Medical History: Past Medical History:  Diagnosis Date   Arthritis    osteo - hands, toes, knees   Atrophic vaginitis    Eczema    GERD (gastroesophageal reflux disease)    occas.   Headache    sinus   Hypertension    Knee pain, right    Motion sickness    boats (sometimes)   Osteoarthritis    Pre-diabetes    Sinusitis  Family History: Family History  Problem Relation Age of Onset   Hypertension Mother    Thyroid disease Mother    Hypertension Father    Breast cancer Neg Hx     Social History   Socioeconomic History   Marital status: Single    Spouse name: Not on file   Number of children: Not on file   Years of education: Not on file   Highest education level: Not on file  Occupational History   Not on file  Tobacco Use   Smoking status: Former     Current packs/day: 0.00    Average packs/day: 0.5 packs/day for 40.6 years (20.3 ttl pk-yrs)    Types: Cigarettes    Start date: 45    Quit date: 04/23/2023    Years since quitting: 0.5   Smokeless tobacco: Never   Tobacco comments:    Quit April 23, 2023.  Vaping Use   Vaping status: Never Used  Substance and Sexual Activity   Alcohol use: Not Currently   Drug use: Never   Sexual activity: Not on file  Other Topics Concern   Not on file  Social History Narrative   Not on file   Social Drivers of Health   Financial Resource Strain: Low Risk  (11/03/2022)   Overall Financial Resource Strain (CARDIA)    Difficulty of Paying Living Expenses: Not very hard  Food Insecurity: No Food Insecurity (11/03/2022)   Hunger Vital Sign    Worried About Running Out of Food in the Last Year: Never true    Ran Out of Food in the Last Year: Never true  Transportation Needs: No Transportation Needs (11/03/2022)   PRAPARE - Administrator, Civil Service (Medical): No    Lack of Transportation (Non-Medical): No  Physical Activity: Not on file  Stress: No Stress Concern Present (11/03/2022)   [employer] of Occupational Health - Occupational Stress Questionnaire    Feeling of Stress : Not at all  Social Connections: Not on file  Intimate Partner Violence: Not At Risk (11/03/2022)   Humiliation, Afraid, Rape, and Kick questionnaire    Fear of Current or Ex-Partner: No    Emotionally Abused: No    Physically Abused: No    Sexually Abused: No      Review of Systems  Constitutional:  Negative for chills, fatigue and unexpected weight change.  HENT:  Negative for congestion, postnasal drip, rhinorrhea, sneezing and sore throat.   Eyes:  Negative for redness.  Respiratory:  Negative for cough, chest tightness and shortness of breath.   Cardiovascular:  Negative for chest pain and palpitations.  Gastrointestinal:  Negative for abdominal pain, constipation, diarrhea, nausea and  vomiting.  Genitourinary:  Negative for dysuria and frequency.  Musculoskeletal:  Positive for arthralgias. Negative for back pain, joint swelling and neck pain.  Skin:  Negative for rash.  Neurological: Negative.  Negative for tremors and numbness.  Hematological:  Negative for adenopathy. Does not bruise/bleed easily.  Psychiatric/Behavioral:  Negative for behavioral problems (Depression), sleep disturbance and suicidal ideas. The patient is not nervous/anxious.     Vital Signs: BP 130/80 Comment: 145/80  Pulse 67   Temp 98.4 F (36.9 C)   Resp 16   Ht 5\' 3"  (1.6 m)   Wt 213 lb (96.6 kg)   SpO2 98%   BMI 37.73 kg/m    Physical Exam Vitals and nursing note reviewed.  Constitutional:      General: She is not in acute distress.  Appearance: She is well-developed. She is not diaphoretic.  HENT:     Head: Normocephalic and atraumatic.     Mouth/Throat:     Pharynx: No oropharyngeal exudate.  Eyes:     Pupils: Pupils are equal, round, and reactive to light.  Neck:     Thyroid: No thyromegaly.     Vascular: No JVD.     Trachea: No tracheal deviation.  Cardiovascular:     Rate and Rhythm: Normal rate and regular rhythm.     Heart sounds: Normal heart sounds. No murmur heard.    No friction rub. No gallop.  Pulmonary:     Effort: Pulmonary effort is normal. No respiratory distress.     Breath sounds: No wheezing.  Chest:  Breasts:    Right: Normal. No mass.     Left: Normal. No mass.  Abdominal:     General: Bowel sounds are normal.     Palpations: Abdomen is soft.  Musculoskeletal:        General: Normal range of motion.     Cervical back: Normal range of motion and neck supple.  Lymphadenopathy:     Cervical: No cervical adenopathy.  Skin:    General: Skin is warm and dry.  Neurological:     Mental Status: She is alert and oriented to person, place, and time.     Cranial Nerves: No cranial nerve deficit.  Psychiatric:        Behavior: Behavior normal.         Thought Content: Thought content normal.        Judgment: Judgment normal.        Assessment/Plan: 1. Essential hypertension (Primary) BP and HR stable, will continue with 1/2 tab carvedilol BID and zestoretic as before. Continue to monitor  2. Obesity (BMI 30-39.9) Will work on diet and exercise   General Counseling: deysha cartier understanding of the findings of todays visit and agrees with plan of treatment. I have discussed any further diagnostic evaluation that may be needed or ordered today. We also reviewed her medications today. she has been encouraged to call the office with any questions or concerns that should arise related to todays visit.    No orders of the defined types were placed in this encounter.   Meds ordered this encounter  Medications   carvedilol (COREG) 25 MG tablet    Sig: Take 0.5 tablets (12.5 mg total) by mouth 2 (two) times daily.    This patient was seen by Lynn Ito, PA-C in collaboration with Dr. Beverely Risen as a part of collaborative care agreement.   Total time spent:30 Minutes Time spent includes review of chart, medications, test results, and follow up plan with the patient.      Dr Lyndon Code Internal medicine

## 2023-11-06 ENCOUNTER — Other Ambulatory Visit: Payer: Self-pay | Admitting: Physician Assistant

## 2023-11-09 ENCOUNTER — Other Ambulatory Visit: Payer: Self-pay

## 2023-11-09 ENCOUNTER — Other Ambulatory Visit: Payer: Self-pay | Admitting: Internal Medicine

## 2023-11-09 MED ORDER — CARVEDILOL 25 MG PO TABS: 12.5000 mg | ORAL_TABLET | Freq: Two times a day (BID) | ORAL | 1 refills | Status: DC

## 2023-11-12 ENCOUNTER — Ambulatory Visit: Payer: Medicare PPO | Admitting: Dermatology

## 2023-12-16 DIAGNOSIS — Z961 Presence of intraocular lens: Secondary | ICD-10-CM | POA: Diagnosis not present

## 2023-12-16 DIAGNOSIS — H353131 Nonexudative age-related macular degeneration, bilateral, early dry stage: Secondary | ICD-10-CM | POA: Diagnosis not present

## 2024-02-08 ENCOUNTER — Other Ambulatory Visit: Payer: Self-pay | Admitting: Physician Assistant

## 2024-02-08 DIAGNOSIS — Z1231 Encounter for screening mammogram for malignant neoplasm of breast: Secondary | ICD-10-CM

## 2024-03-01 ENCOUNTER — Ambulatory Visit
Admission: RE | Admit: 2024-03-01 | Discharge: 2024-03-01 | Disposition: A | Discharge status: Home / Self Care | Source: Ambulatory Visit | Attending: Physician Assistant | Admitting: Physician Assistant

## 2024-03-01 DIAGNOSIS — Z1231 Encounter for screening mammogram for malignant neoplasm of breast: Secondary | ICD-10-CM | POA: Insufficient documentation

## 2024-04-07 ENCOUNTER — Ambulatory Visit: Payer: Medicare PPO | Admitting: Physician Assistant

## 2024-05-02 ENCOUNTER — Ambulatory Visit: Payer: Medicare PPO | Admitting: Physician Assistant

## 2024-05-02 ENCOUNTER — Encounter: Payer: Self-pay | Admitting: Physician Assistant

## 2024-05-02 VITALS — BP 132/74 | HR 73 | Temp 98.1°F | Resp 16 | Ht 63.0 in | Wt 202.6 lb

## 2024-05-02 DIAGNOSIS — R946 Abnormal results of thyroid function studies: Secondary | ICD-10-CM

## 2024-05-02 DIAGNOSIS — E1159 Type 2 diabetes mellitus with other circulatory complications: Secondary | ICD-10-CM

## 2024-05-02 DIAGNOSIS — E782 Mixed hyperlipidemia: Secondary | ICD-10-CM | POA: Diagnosis not present

## 2024-05-02 DIAGNOSIS — Z0000 Encounter for general adult medical examination without abnormal findings: Secondary | ICD-10-CM | POA: Diagnosis not present

## 2024-05-02 DIAGNOSIS — I10 Essential (primary) hypertension: Secondary | ICD-10-CM | POA: Diagnosis not present

## 2024-05-02 DIAGNOSIS — R5383 Other fatigue: Secondary | ICD-10-CM | POA: Diagnosis not present

## 2024-05-02 NOTE — Progress Notes (Signed)
 [facility] [address] [place], [place] [address]  Internal MEDICINE  Office Visit Note  Patient Name: Donna Beasley  [id number]  [id number]  Date of Service: 05/02/2024  Chief Complaint  Patient presents with   Follow-up   Gastroesophageal Reflux   Hypertension   Quality Metric Gaps    AWV    HPI Pt is here for annual Medicare wellness visit -Doing well on 12.5mg  coreg  BID  and zestoretic , BP and HR stable. Advised to monitor at home some -went to pool and walked this morning  -over a year without smoking! Declines lung cancer screening CT at this time and will think about it -has gotten first shingles vaccine 02/08/24 and will get next dose soon. Tolerated well -mammogram done in June, UTD on colonoscopy -due for labs--ordered     05/02/2024    9:16 AM 04/03/2023    9:02 AM 09/27/2021    9:02 AM  MMSE - Mini Mental State Exam  Orientation to time 5 5 5   Orientation to Place 5 5 5   Registration 3 3 3   Attention/ Calculation 5 5 5   Recall 3 3 3   Language- name 2 objects 2 2 2   Language- repeat 1 1 1   Language- follow 3 step command 3 3 3   Language- read & follow direction 1 1 1   Write a sentence 1 1 1   Copy design 1 1 1   Total score 30 30 30        05/02/2024    8:39 AM 10/05/2023    9:22 AM 04/03/2023    9:01 AM 03/28/2022    8:38 AM 09/27/2021    9:01 AM  Depression screen PHQ 2/9  Decreased Interest 0 0 0 0 0  Down, Depressed, Hopeless 0 0 0 0 0  PHQ - 2 Score 0 0 0 0 0       09/27/2021    9:01 AM 03/28/2022    8:38 AM 04/03/2023    9:01 AM 10/05/2023    9:22 AM 05/02/2024    8:39 AM  Fall Risk  Falls in the past year? 0 0 0 0 0  Was there an injury with Fall?   0    Fall Risk Category Calculator   0    Patient at Risk for Falls Due to   No Fall Risks    Fall risk Follow up   Falls evaluation completed       Current Medication: Outpatient Encounter Medications as of 05/02/2024  Medication Sig   carvedilol  (COREG ) 25 MG tablet Take 0.5  tablets (12.5 mg total) by mouth 2 (two) times daily.   hydrocortisone cream 0.5 % Apply 1 application  topically 2 (two) times daily as needed for itching.   lisinopril -hydrochlorothiazide  (ZESTORETIC ) 20-12.5 MG tablet TAKE 1 TABLET BY MOUTH EVERY DAY   Multiple Vitamin (MULTIVITAMIN) tablet Take 1 tablet by mouth daily.   Multiple Vitamins-Minerals (PRESERVISION AREDS PO) Take by mouth in the morning and at bedtime.   triamcinolone cream (KENALOG) 0.1 % Apply 1 application  topically 2 (two) times daily as needed.   No facility-administered encounter medications on file as of 05/02/2024.    Surgical History: Past Surgical History:  Procedure Laterality Date   BIOPSY N/A 11/19/2020   Procedure: BIOPSY;  Surgeon: Jinny Carmine, MD;  Location: [facility];  Service: Endoscopy;  Laterality: N/A;   CATARACT EXTRACTION W/PHACO Right 07/07/2023   Procedure: CATARACT EXTRACTION PHACO AND INTRAOCULAR LENS PLACEMENT (IOC) RIGHT  CLAREON VIVITY  TORIC LENS;  Surgeon: Jaye Fallow, MD;  Location: [facility];  Service: Ophthalmology;  Laterality: Right;  18.92 1:29.7   CATARACT EXTRACTION W/PHACO Left 07/21/2023   Procedure: CATARACT EXTRACTION PHACO AND INTRAOCULAR LENS PLACEMENT (IOC) LEFT  CLAREON VIVITY TORIC LENS 11.12 1:01.7;  Surgeon: Jaye Fallow, MD;  Location: [facility];  Service: Ophthalmology;  Laterality: Left;   COLONOSCOPY WITH PROPOFOL  N/A 07/02/2015   Procedure: COLONOSCOPY WITH PROPOFOL ;  Surgeon: Rogelia Copping, MD;  Location: [facility];  Service: Endoscopy;  Laterality: N/A;   COLONOSCOPY WITH PROPOFOL  N/A 11/19/2020   Procedure: COLONOSCOPY WITH PROPOFOL ;  Surgeon: Copping Rogelia, MD;  Location: [facility];  Service: Endoscopy;  Laterality: N/A;   POLYPECTOMY  07/02/2015   Procedure: POLYPECTOMY;  Surgeon: Rogelia Copping, MD;  Location: [facility];  Service: Endoscopy;;   POLYPECTOMY N/A 11/19/2020   Procedure: POLYPECTOMY;   Surgeon: Copping Rogelia, MD;  Location: [facility];  Service: Endoscopy;  Laterality: N/A;   UTERINE FIBROID SURGERY      Medical History: Past Medical History:  Diagnosis Date   Arthritis    osteo - hands, toes, knees   Atrophic vaginitis    Eczema    GERD (gastroesophageal reflux disease)    occas.   Headache    sinus   Hypertension    Knee pain, right    Motion sickness    boats (sometimes)   Osteoarthritis    Pre-diabetes    Sinusitis     Family History: Family History  Problem Relation Age of Onset   Hypertension Mother    Thyroid  disease Mother    Hypertension Father    Breast cancer Neg Hx     Social History   Socioeconomic History   Marital status: Single    Spouse name: Not on file   Number of children: Not on file   Years of education: Not on file   Highest education level: Not on file  Occupational History   Not on file  Tobacco Use   Smoking status: Former    Current packs/day: 0.00    Average packs/day: 0.5 packs/day for 40.6 years (20.3 ttl pk-yrs)    Types: Cigarettes    Start date: 45    Quit date: 04/23/2023    Years since quitting: 1.0   Smokeless tobacco: Never   Tobacco comments:    Quit April 23, 2023.  Vaping Use   Vaping status: Never Used  Substance and Sexual Activity   Alcohol use: Not Currently   Drug use: Never   Sexual activity: Not on file  Other Topics Concern   Not on file  Social History Narrative   Not on file   Social Drivers of Health   Financial Resource Strain: Low Risk  (11/03/2022)   Overall Financial Resource Strain (CARDIA)    Difficulty of Paying Living Expenses: Not very hard  Food Insecurity: No Food Insecurity (11/03/2022)   Hunger Vital Sign    Worried About Running Out of Food in the Last Year: Never true    Ran Out of Food in the Last Year: Never true  Transportation Needs: No Transportation Needs (11/03/2022)   PRAPARE - Administrator, Civil Service (Medical): No    Lack of  Transportation (Non-Medical): No  Physical Activity: Not on file  Stress: No Stress Concern Present (11/03/2022)   [employer] of Occupational Health - Occupational Stress Questionnaire    Feeling of Stress : Not at all  Social  Connections: Not on file  Intimate Partner Violence: Not At Risk (11/03/2022)   Humiliation, Afraid, Rape, and Kick questionnaire    Fear of Current or Ex-Partner: No    Emotionally Abused: No    Physically Abused: No    Sexually Abused: No      Review of Systems  Constitutional:  Negative for chills, fatigue and unexpected weight change.  HENT:  Negative for congestion, postnasal drip, rhinorrhea, sneezing and sore throat.   Eyes:  Negative for redness.  Respiratory:  Negative for cough, chest tightness and shortness of breath.   Cardiovascular:  Negative for chest pain and palpitations.  Gastrointestinal:  Negative for abdominal pain, constipation, diarrhea, nausea and vomiting.  Genitourinary:  Negative for dysuria and frequency.  Musculoskeletal:  Positive for arthralgias. Negative for back pain, joint swelling and neck pain.  Skin:  Negative for rash.  Neurological: Negative.  Negative for tremors and numbness.  Hematological:  Negative for adenopathy. Does not bruise/bleed easily.  Psychiatric/Behavioral:  Negative for behavioral problems (Depression), sleep disturbance and suicidal ideas. The patient is not nervous/anxious.     Vital Signs: BP 132/74 Comment: 148/73  Pulse 73   Temp 98.1 F (36.7 C)   Resp 16   Ht 5' 3 (1.6 m)   Wt 202 lb 9.6 oz (91.9 kg)   SpO2 97%   BMI 35.89 kg/m    Physical Exam Vitals and nursing note reviewed.  Constitutional:      Appearance: Normal appearance.  HENT:     Head: Normocephalic and atraumatic.  Eyes:     Extraocular Movements: Extraocular movements intact.  Cardiovascular:     Rate and Rhythm: Normal rate and regular rhythm.  Pulmonary:     Effort: Pulmonary effort is normal.     Breath  sounds: Normal breath sounds.  Skin:    General: Skin is warm and dry.  Neurological:     Mental Status: She is alert and oriented to person, place, and time.  Psychiatric:        Mood and Affect: Mood normal.        Behavior: Behavior normal.        Assessment/Plan: 1. Encounter for annual wellness exam in Medicare patient (Primary) AWV performed, labs ordered, declines lung cancer screening  2. Essential hypertension Stable, continue current medications  3. Type 2 diabetes mellitus with other circulatory complications (HCC) Diet controlled, will update A1c with labs - Hgb A1C w/o eAG  4. Mixed hyperlipidemia - Lipid Panel With LDL/HDL Ratio  5. Abnormal thyroid  exam - TSH + free T4  6. Other fatigue - CBC w/Diff/Platelet - Comprehensive metabolic panel with GFR - TSH + free T4 - Hgb A1C w/o eAG - Lipid Panel With LDL/HDL Ratio   General Counseling: enedina pair understanding of the findings of todays visit and agrees with plan of treatment. I have discussed any further diagnostic evaluation that may be needed or ordered today. We also reviewed her medications today. she has been encouraged to call the office with any questions or concerns that should arise related to todays visit.    Orders Placed This Encounter  Procedures   CBC w/Diff/Platelet   Comprehensive metabolic panel with GFR   TSH + free T4   Hgb A1C w/o eAG   Lipid Panel With LDL/HDL Ratio    No orders of the defined types were placed in this encounter.   This patient was seen by Tinnie Pro, PA-C in collaboration with Dr. Sigrid Bathe as a  part of collaborative care agreement.   Total time spent:30 Minutes Time spent includes review of chart, medications, test results, and follow up plan with the patient.      Dr Fozia M Khan Internal medicine

## 2024-05-03 ENCOUNTER — Other Ambulatory Visit: Payer: Self-pay | Admitting: Physician Assistant

## 2024-10-31 ENCOUNTER — Ambulatory Visit: Admitting: Physician Assistant

## 2025-05-05 ENCOUNTER — Ambulatory Visit: Admitting: Physician Assistant
